# Patient Record
Sex: Male | Born: 1955 | Race: White | Hispanic: No | Marital: Married | Smoking: Never smoker
Health system: Southern US, Community
[De-identification: ages and names within clinical notes are randomized; demographics above are authoritative.]

## PROBLEM LIST (undated history)

## (undated) ENCOUNTER — Emergency Department (HOSPITAL_BASED_OUTPATIENT_CLINIC_OR_DEPARTMENT_OTHER): Admission: EM | Payer: Self-pay | Source: Home / Self Care

## (undated) DIAGNOSIS — Z87442 Personal history of urinary calculi: Secondary | ICD-10-CM

## (undated) DIAGNOSIS — I4819 Other persistent atrial fibrillation: Secondary | ICD-10-CM

## (undated) DIAGNOSIS — E785 Hyperlipidemia, unspecified: Secondary | ICD-10-CM

## (undated) DIAGNOSIS — N200 Calculus of kidney: Secondary | ICD-10-CM

## (undated) HISTORY — DX: Calculus of kidney: N20.0

## (undated) HISTORY — DX: Hyperlipidemia, unspecified: E78.5

## (undated) HISTORY — PX: OTHER SURGICAL HISTORY: SHX169

## (undated) HISTORY — PX: EYE SURGERY: SHX253

## (undated) HISTORY — DX: Other persistent atrial fibrillation: I48.19

---

## 2013-05-09 ENCOUNTER — Encounter: Payer: Self-pay | Admitting: Internal Medicine

## 2013-05-31 DIAGNOSIS — I4891 Unspecified atrial fibrillation: Secondary | ICD-10-CM

## 2013-05-31 DIAGNOSIS — I4819 Other persistent atrial fibrillation: Secondary | ICD-10-CM

## 2013-05-31 HISTORY — DX: Other persistent atrial fibrillation: I48.19

## 2013-05-31 HISTORY — DX: Unspecified atrial fibrillation: I48.91

## 2013-06-06 ENCOUNTER — Ambulatory Visit (INDEPENDENT_AMBULATORY_CARE_PROVIDER_SITE_OTHER): Payer: Managed Care, Other (non HMO) | Admitting: Internal Medicine

## 2013-06-06 ENCOUNTER — Encounter: Payer: Self-pay | Admitting: Internal Medicine

## 2013-06-06 VITALS — BP 118/84 | HR 58 | Ht 74.0 in | Wt 188.0 lb

## 2013-06-06 DIAGNOSIS — I4891 Unspecified atrial fibrillation: Secondary | ICD-10-CM

## 2013-06-06 LAB — T4, FREE: Free T4: 0.95 ng/dL (ref 0.60–1.60)

## 2013-06-06 LAB — TSH: TSH: 1.14 u[IU]/mL (ref 0.35–5.50)

## 2013-06-06 NOTE — Progress Notes (Signed)
Primary Care / Referring Physician: Garlan FillersPATERSON,DANIEL G, MD   Lalla BrothersWilliam Mesch is a 58 y.o. male with a h/o newly diagnosed atrial fibrillation who presents today for cardiology consultation.  He reports that he was being evaluated by Urology 05/30/13 and was noted to have an irregular and fast heart beat.  He had an ekg obtained by Dr Jarold MottoPatterson which revealed afib with RVR.  He was placed on toprol and eliquis.  His insurance "would not allow eliquis" and he was therefore placed on coumadin.  He has done well since that time.   He reports that he has symptoms of fatigue with his afib.  He reports associated anxiety and "nervousness".  He thinks that retrospectively he has had these symptoms for several months.  Symptoms have been somewhat insidious.  He is unaware of triggers or precipitants.  He thinks that drinking ETOh over the holidays may have triggered increased episode.  Today, he denies symptoms of chest pain, shortness of breath, orthopnea, PND, lower extremity edema, dizziness, presyncope, syncope, or neurologic sequela. The patient is tolerating medications without difficulties and is otherwise without complaint today.   Past Medical History  Diagnosis Date  . Hyperlipidemia   . Nephrolithiasis   . Atrial fibrillation 05/31/13    New onset a fib w/RVR   Past Surgical History  Procedure Laterality Date  . None      Current Outpatient Prescriptions  Medication Sig Dispense Refill  . ASCORBIC ACID PO Take 1 tablet by mouth daily.      . Cholecalciferol (VITAMIN D PO) Take 1 capsule by mouth daily.      Marland Kitchen. doxycycline (VIBRA-TABS) 100 MG tablet Take 1 tablet by mouth 2 (two) times daily.      . metoprolol succinate (TOPROL-XL) 50 MG 24 hr tablet Take 50 mg by mouth daily. Take with or immediately following a meal.      . Multiple Vitamin (MULTIVITAMIN) capsule Take 1 capsule by mouth daily. (Centrum)      . vitamin E 400 UNIT capsule Take 400 Units by mouth daily.      Marland Kitchen. warfarin  (COUMADIN) 5 MG tablet Take 1 tablet by mouth daily.       No current facility-administered medications for this visit.    No Known Allergies  History   Social History  . Marital Status: Unknown    Spouse Name: N/A    Number of Children: N/A  . Years of Education: N/A   Occupational History  . Not on file.   Social History Main Topics  . Smoking status: Never Smoker   . Smokeless tobacco: Not on file  . Alcohol Use: 4.2 oz/week    7 Cans of beer per week     Comment: 1 beer a day, 3-5 beers on the weekends  . Drug Use: No  . Sexual Activity: Not on file   Other Topics Concern  . Not on file   Social History Narrative   Pt lives in [place].   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 CAD      < 55   . Hypertension      ROS- All systems are reviewed and negative except as per the HPI above, also has symptoms of prostatitis, denies snoring/ sleep apnea  Physical Exam: Filed Vitals:   06/06/13 1149  BP: 118/84  Pulse: 58  Height: 6\' 2"  (1.88 m)  Weight: 188 lb (85.276 kg)    GEN- The patient is well appearing, alert and oriented x 3 today.   Head- normocephalic, atraumatic Eyes-  Sclera clear, conjunctiva pink Ears- hearing intact Oropharynx- clear Neck- supple, no JVP Lymph- no cervical lymphadenopathy Lungs- Clear to ausculation bilaterally, normal work of breathing Heart- Regular rate and rhythm, no murmurs, rubs or gallops, PMI not laterally displaced GI- soft, NT, ND, + BS Extremities- no clubbing, cyanosis, or edema MS- no significant deformity or atrophy Skin- no rash or lesion Psych- euthymic mood, full affect Neuro- strength and sensation are intact  EKG today reveals sinus rhythm 58 bpm, otherwise normal ekg ekg 05/30/13 [facility]) documents afib at 146 bpm  Assessment and Plan:  1. Paroxysmal atrial fibrillation The patient presents with a  new diagnosis of afib.  He has well documented afib with RVR from [facility].  He has been placed on toprol and is tolerating this well.  Appropriately, he has placed on anticoagulation in case he did not convert to sinus and cardioversion was required.  Fortunately, he has spontaneously converted to sinus.  As his chads2vasc score is 0, I will stop anticoagulation at this time. He denies symptoms of sleep apnea.  He does drink ETOH heavily and admits to drinking more frequently recently.  I will therefore recommend ETOH cessation/ moderation going forward.  I will obtain TFTs today as well as an echo to evaluate for structural changes. I will not add any additional therapies at this time.  If his afib burden increases, I think that our options are multaq or flecainide.  I would reserve ablation for afib refractory to a trial of AAD therapy.  He will return in 6 weeks for follow-up He will contact my office if problems arise in the interim.

## 2013-06-06 NOTE — Patient Instructions (Addendum)
Your physician recommends that you schedule a follow-up appointment in: 6 weeks with Dr Johney Frame  Your physician recommends that you return for lab work today: TSH Free T4  Your physician has recommended you make the following change in your medication:  1) STOP Coumadin   No Alcohol    Your physician has requested that you have an echocardiogram. Echocardiography is a painless test that uses sound waves to create images of your heart. It provides your doctor with information about the size and shape of your heart and how well your heart's chambers and valves are working. This procedure takes approximately one hour. There are no restrictions for this procedure.

## 2013-06-15 ENCOUNTER — Ambulatory Visit (HOSPITAL_COMMUNITY): Payer: Managed Care, Other (non HMO) | Attending: Internal Medicine | Admitting: Radiology

## 2013-06-15 ENCOUNTER — Encounter: Payer: Self-pay | Admitting: Internal Medicine

## 2013-06-15 DIAGNOSIS — I059 Rheumatic mitral valve disease, unspecified: Secondary | ICD-10-CM | POA: Insufficient documentation

## 2013-06-15 DIAGNOSIS — Z8249 Family history of ischemic heart disease and other diseases of the circulatory system: Secondary | ICD-10-CM | POA: Insufficient documentation

## 2013-06-15 DIAGNOSIS — E785 Hyperlipidemia, unspecified: Secondary | ICD-10-CM | POA: Insufficient documentation

## 2013-06-15 DIAGNOSIS — I4891 Unspecified atrial fibrillation: Secondary | ICD-10-CM

## 2013-06-15 NOTE — Progress Notes (Signed)
Echocardiogram performed.  

## 2013-07-18 ENCOUNTER — Ambulatory Visit (INDEPENDENT_AMBULATORY_CARE_PROVIDER_SITE_OTHER): Payer: Managed Care, Other (non HMO) | Admitting: Internal Medicine

## 2013-07-18 ENCOUNTER — Encounter: Payer: Self-pay | Admitting: Internal Medicine

## 2013-07-18 VITALS — BP 136/88 | HR 61 | Ht 74.0 in | Wt 190.2 lb

## 2013-07-18 DIAGNOSIS — I4891 Unspecified atrial fibrillation: Secondary | ICD-10-CM

## 2013-07-18 MED ORDER — DILTIAZEM HCL ER COATED BEADS 120 MG PO CP24: 120.0000 mg | ORAL_CAPSULE | Freq: Every day | ORAL | Status: DC

## 2013-07-18 NOTE — Patient Instructions (Signed)
Your physician wants you to follow-up in: 4 months with Tyler Herrera and 12 months with Dr Jacquiline Doe will receive a reminder letter in the mail two months in advance. If you don't receive a letter, please call our office to schedule the follow-up appointment.  Your physician has recommended you make the following change in your medication:  1) Stop Metoprolol 2) Start Cardizem 120mg  daily

## 2013-07-19 NOTE — Progress Notes (Signed)
Primary Care / Referring Physician: Garlan FillersPATERSON,DANIEL G, MD   Lalla BrothersWilliam Herrera is a 58 y.o. male with a h/o recently diagnosed atrial fibrillation who presents today for EP follow-up. He has done well since I saw him last, without symptomatic recurrence of afib.  He has tapered his ETOH intake.  He has some fatigue with beta blockers.  Today, he denies symptoms of chest pain, shortness of breath, orthopnea, PND, lower extremity edema, dizziness, presyncope, syncope, or neurologic sequela. The patient is tolerating medications without difficulties and is otherwise without complaint today.   Past Medical History  Diagnosis Date  . Hyperlipidemia   . Nephrolithiasis   . Atrial fibrillation 05/31/13    New onset a fib w/RVR   Past Surgical History  Procedure Laterality Date  . None      Current Outpatient Prescriptions  Medication Sig Dispense Refill  . ASCORBIC ACID PO Take 1 tablet by mouth daily.      . Cholecalciferol (VITAMIN D PO) Take 1 capsule by mouth daily.      Marland Kitchen. doxycycline (VIBRA-TABS) 100 MG tablet Take 1 tablet by mouth 2 (two) times daily.      . Multiple Vitamin (MULTIVITAMIN) capsule Take 1 capsule by mouth daily. (Centrum)      . vitamin E 400 UNIT capsule Take 400 Units by mouth daily.      Marland Kitchen. diltiazem (CARDIZEM CD) 120 MG 24 hr capsule Take 1 capsule (120 mg total) by mouth daily.  90 capsule  3   No current facility-administered medications for this visit.    No Known Allergies  History   Social History  . Marital Status: Unknown    Spouse Name: N/A    Number of Children: N/A  . Years of Education: N/A   Occupational History  . Not on file.   Social History Main Topics  . Smoking status: Never Smoker   . Smokeless tobacco: Not on file  . Alcohol Use: 4.2 oz/week    7 Cans of beer per week     Comment: 1 beer a day, 3-5 beers on the weekends  . Drug Use: No  . Sexual Activity: Not on file   Other Topics Concern  . Not on file   Social History  Narrative   Pt lives in [place] in [place].   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 CAD      < 55   . Hypertension      ROS- All systems are reviewed and negative except as per the HPI above, also has symptoms of prostatitis, denies snoring/ sleep apnea  Physical Exam: Filed Vitals:   07/18/13 1550  BP: 136/88  Pulse: 61  Height: 6\' 2"  (1.88 m)  Weight: 190 lb 3.2 oz (86.274 kg)    GEN- The patient is well appearing, alert and oriented x 3 today.   Head- normocephalic, atraumatic Eyes-  Sclera clear, conjunctiva pink Ears- hearing intact Oropharynx- clear Neck- supple, no JVP Lymph- no cervical lymphadenopathy Lungs- Clear to ausculation bilaterally, normal work of breathing Heart- Regular rate and rhythm, no murmurs, rubs or gallops, PMI not laterally displaced GI- soft, NT, ND, + BS Extremities- no clubbing, cyanosis, or edema MS- no significant deformity or atrophy Skin- no rash or lesion Psych- euthymic mood, full affect Neuro- strength and sensation are intact  EKG today reveals sinus rhythm  61 bpm, otherwise normal ekg ekg 05/30/13 ([facility]) documents afib at 146 bpm  Assessment and Plan:  1. Paroxysmal atrial fibrillation Maintaining sinus rhythm.  chads2vasc score is 0.  I will not therefore initiate anticoagulation at this time.  Continue ETOH cessation/ moderation going forward. TFTs and echo are reviewed with him today and were normal. If his afib burden increases, I think that our options are multaq or flecainide.  I would reserve ablation for afib refractory to a trial of AAD therapy. Given fatigue, I have switched metoprolol to diltiazem today.  He will return in 4 months for follow-up with Tereso Newcomer I will see him again in 1 year

## 2014-07-09 ENCOUNTER — Other Ambulatory Visit: Payer: Self-pay | Admitting: Internal Medicine

## 2014-08-05 ENCOUNTER — Other Ambulatory Visit: Payer: Self-pay | Admitting: Internal Medicine

## 2014-09-12 ENCOUNTER — Telehealth: Payer: Self-pay | Admitting: Internal Medicine

## 2014-09-12 NOTE — Telephone Encounter (Signed)
New problem   Need to speak nurse concerning the status of questionaire that was fax on 09/05/14. Please advise

## 2014-09-12 NOTE — Telephone Encounter (Signed)
lmom for Tyler Herrera  I have not gotten a faxed form

## 2014-09-18 NOTE — Telephone Encounter (Signed)
F/u   Want to know status of questionnaire. I advised Colin Mulders to re fax form.

## 2014-09-18 NOTE — Telephone Encounter (Signed)
Left Colin Mulders a message that Tresa Endo was not in the office today, and I will routing this message to [place].

## 2014-09-19 ENCOUNTER — Telehealth: Payer: Self-pay | Admitting: Internal Medicine

## 2014-09-19 NOTE — Telephone Encounter (Signed)
Left message on voicemail again to refax form as I have not received the form as of today

## 2014-09-19 NOTE — Telephone Encounter (Signed)
Form filled out and will fax today.  I have given to Selena Batten in medical records

## 2014-09-19 NOTE — Telephone Encounter (Signed)
EMSI questionnaire form filled out and faxed to [phone]. Scanned In

## 2014-10-09 ENCOUNTER — Telehealth: Payer: Self-pay | Admitting: Internal Medicine

## 2014-10-09 NOTE — Telephone Encounter (Signed)
This was faxed on 09/18/14  Will refax

## 2014-10-09 NOTE — Telephone Encounter (Signed)
[employer] company is calling because there was a cardiac questionnaire faxed in to the office on September 05, 2014 and they have not received it back. Please give them a call.

## 2014-10-10 ENCOUNTER — Telehealth: Payer: Self-pay | Admitting: Internal Medicine

## 2014-10-10 NOTE — Telephone Encounter (Signed)
EMSI questionnaire re-faxed to [phone].

## 2014-10-13 ENCOUNTER — Other Ambulatory Visit: Payer: Self-pay | Admitting: Internal Medicine

## 2014-10-15 ENCOUNTER — Other Ambulatory Visit: Payer: Self-pay

## 2014-10-15 MED ORDER — DILTIAZEM HCL ER COATED BEADS 120 MG PO CP24: 120.0000 mg | ORAL_CAPSULE | Freq: Every day | ORAL | Status: DC

## 2014-11-13 ENCOUNTER — Encounter: Payer: Self-pay | Admitting: Internal Medicine

## 2014-11-13 ENCOUNTER — Ambulatory Visit (INDEPENDENT_AMBULATORY_CARE_PROVIDER_SITE_OTHER): Payer: Managed Care, Other (non HMO) | Admitting: Internal Medicine

## 2014-11-13 VITALS — BP 126/84 | HR 60 | Ht 74.0 in | Wt 199.4 lb

## 2014-11-13 DIAGNOSIS — I480 Paroxysmal atrial fibrillation: Secondary | ICD-10-CM

## 2014-11-13 NOTE — Progress Notes (Signed)
  Primary Care / Referring Physician: Garlan Fillers, MD   Tyler Herrera is a 59 y.o. male with a h/o recently diagnosed atrial fibrillation who presents today for EP follow-up. He has done well since I saw him last, without symptomatic recurrence of afib.  He has tapered his ETOH intake.   Today, he denies symptoms of chest pain, shortness of breath, orthopnea, PND, lower extremity edema, dizziness, presyncope, syncope, or neurologic sequela. The patient is tolerating medications without difficulties and is otherwise without complaint today.   Past Medical History  Diagnosis Date  . Hyperlipidemia   . Nephrolithiasis   . Atrial fibrillation 05/31/13    New onset a fib w/RVR   Past Surgical History  Procedure Laterality Date  . None      Current Outpatient Prescriptions  Medication Sig Dispense Refill  . aspirin 81 MG tablet Take 81 mg by mouth daily.    Marland Kitchen diltiazem (CARDIZEM CD) 120 MG 24 hr capsule Take 1 capsule (120 mg total) by mouth daily. 90 capsule 0  . Multiple Vitamin (MULTIVITAMIN) capsule Take 1 capsule by mouth daily. (Centrum)     No current facility-administered medications for this visit.    No Known Allergies  History   Social History  . Marital Status: Unknown    Spouse Name: N/A  . Number of Children: N/A  . Years of Education: N/A   Occupational History  . Not on file.   Social History Main Topics  . Smoking status: Never Smoker   . Smokeless tobacco: Not on file  . Alcohol Use: 4.2 oz/week    7 Cans of beer per week     Comment: 1 beer a day, 3-5 beers on the weekends  . Drug Use: No  . Sexual Activity: Not on file   Other Topics Concern  . Not on file   Social History Narrative   Pt lives in [place] in [place].   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 CAD      < 55   . Hypertension      ROS- All systems are reviewed and negative except as per  the HPI above, also has symptoms of prostatitis, denies snoring/ sleep apnea  Physical Exam: Filed Vitals:   11/13/14 1621  BP: 126/84  Pulse: 60  Height: 6\' 2"  (1.88 m)  Weight: 90.447 kg (199 lb 6.4 oz)    GEN- The patient is well appearing, alert and oriented x 3 today.   Head- normocephalic, atraumatic Eyes-  Sclera clear, conjunctiva pink Ears- hearing intact Oropharynx- clear Neck- supple,  Lungs- Clear to ausculation bilaterally, normal work of breathing Heart- Regular rate and rhythm, no murmurs, rubs or gallops, PMI not laterally displaced GI- soft, NT, ND, + BS Extremities- no clubbing, cyanosis, or edema MS- no significant deformity or atrophy Skin- no rash or lesion  EKG today reveals sinus rhythm   Assessment and Plan:  1. Paroxysmal atrial fibrillation Maintaining sinus rhythm.  chads2vasc score is 0.  I will not therefore initiate anticoagulation at this time.  Continue ETOH cessation/ moderation going forward.   If his afib burden increases, I think that our options are multaq or flecainide.  I would reserve ablation for afib refractory to a trial of AAD therapy.   I will see him again in 1 year He will contact my office should problems arise in the interim.

## 2014-11-13 NOTE — Patient Instructions (Signed)

## 2015-01-08 ENCOUNTER — Other Ambulatory Visit: Payer: Self-pay | Admitting: Internal Medicine

## 2015-01-13 ENCOUNTER — Other Ambulatory Visit: Payer: Self-pay | Admitting: Internal Medicine

## 2016-01-08 ENCOUNTER — Other Ambulatory Visit: Payer: Self-pay | Admitting: Internal Medicine

## 2016-01-26 ENCOUNTER — Other Ambulatory Visit: Payer: Self-pay | Admitting: Internal Medicine

## 2016-01-26 MED ORDER — DILTIAZEM HCL ER COATED BEADS 120 MG PO CP24: 120.0000 mg | ORAL_CAPSULE | Freq: Every day | ORAL | 1 refills | Status: DC

## 2016-01-26 NOTE — Telephone Encounter (Signed)
Pt needed appt for refills, appt made with Allred for 02-26-16.

## 2016-01-26 NOTE — Telephone Encounter (Signed)
Pt sent a mychart message he needed an appt for refills, made appt for 02-26-16 with Allred, sent patient a message asking which med, pt states he only takes one, Diltiazem 120mg  24 hr Caps.

## 2016-01-26 NOTE — Telephone Encounter (Signed)
Pt's medication was refilled and sent to pt's pharmacy as requested. Confirmation received.  °

## 2016-01-26 NOTE — Telephone Encounter (Signed)
Please find out what medications pt is talking about. Thanks

## 2016-02-26 ENCOUNTER — Ambulatory Visit (INDEPENDENT_AMBULATORY_CARE_PROVIDER_SITE_OTHER): Payer: Managed Care, Other (non HMO) | Admitting: Internal Medicine

## 2016-02-26 ENCOUNTER — Other Ambulatory Visit: Payer: Self-pay

## 2016-02-26 VITALS — BP 150/98 | HR 114 | Ht 74.0 in | Wt 200.6 lb

## 2016-02-26 DIAGNOSIS — I4819 Other persistent atrial fibrillation: Secondary | ICD-10-CM

## 2016-02-26 DIAGNOSIS — I10 Essential (primary) hypertension: Secondary | ICD-10-CM

## 2016-02-26 DIAGNOSIS — I481 Persistent atrial fibrillation: Secondary | ICD-10-CM | POA: Diagnosis not present

## 2016-02-26 DIAGNOSIS — I480 Paroxysmal atrial fibrillation: Secondary | ICD-10-CM

## 2016-02-26 MED ORDER — DILTIAZEM HCL ER COATED BEADS 120 MG PO CP24: 240.0000 mg | ORAL_CAPSULE | Freq: Every day | ORAL | 3 refills | Status: DC

## 2016-02-26 NOTE — Progress Notes (Signed)
Primary Care / Referring Physician: Garlan FillersPATERSON,DANIEL G, MD  Lalla BrothersWilliam Dorce is a 60 y.o. male with a h/o persistent atrial fibrillation who presents today for EP follow-up.  He has rare palpitations but seems mostly asymptomatic with his afib.  He is in afib today and mostly unaware. Today, he denies symptoms of chest pain, shortness of breath, orthopnea, PND, lower extremity edema, dizziness, presyncope, syncope, or neurologic sequela. The patient is tolerating medications without difficulties and is otherwise without complaint today.   Past Medical History:  Diagnosis Date  . Atrial fibrillation 05/31/13   New onset a fib w/RVR  . Hyperlipidemia   . Nephrolithiasis    Past Surgical History:  Procedure Laterality Date  . none      Current Outpatient Prescriptions  Medication Sig Dispense Refill  . aspirin 81 MG tablet Take 81 mg by mouth daily.    Marland Kitchen. diltiazem (CARDIZEM CD) 120 MG 24 hr capsule Take 1 capsule (120 mg total) by mouth daily. 30 capsule 1  . Multiple Vitamin (MULTIVITAMIN) capsule Take 1 capsule by mouth daily. (Centrum)     No current facility-administered medications for this visit.     No Known Allergies  Social History   Social History  . Marital status: Unknown    Spouse name: N/A  . Number of children: N/A  . Years of education: N/A   Occupational History  . Not on file.   Social History Main Topics  . Smoking status: Never Smoker  . Smokeless tobacco: Not on file  . Alcohol use 4.2 oz/week    7 Cans of beer per week     Comment: 1 beer a day, 3-5 beers on the weekends  . Drug use: No  . Sexual activity: Not on file   Other Topics Concern  . Not on file   Social History Narrative   Pt lives in [place] in [place].   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 CAD      < 55   . Hypertension      ROS- All systems are reviewed and negative except as per the  HPI above, also has symptoms of prostatitis, denies snoring/ sleep apnea  Physical Exam: Vitals:   02/26/16 1611  BP: (!) 150/98  Pulse: (!) 114  Weight: 200 lb 9.6 oz (91 kg)  Height: 6\' 2"  (1.88 m)    GEN- The patient is well appearing, alert and oriented x 3 today.   Head- normocephalic, atraumatic Eyes-  Sclera clear, conjunctiva pink Ears- hearing intact Oropharynx- clear Neck- supple,  Lungs- Clear to ausculation bilaterally, normal work of breathing Heart- tachycardic irregular rhythm, no murmurs, rubs or gallops, PMI not laterally displaced GI- soft, NT, ND, + BS Extremities- no clubbing, cyanosis, or edema MS- no significant deformity or atrophy Skin- no rash or lesion  EKG today reveals afib with V rate 114 bpm,   Assessment and Plan:  1. Persistent atrial fibrillation Back in afib today but unaware.  V rates are elevated.  chads2vasc score is 0.  He is therefore not anticoagulated.  Given elevated BP today however, I suspect his chads2vasc score is actually 1.  We should probably reconsider anticoagulation.  Increase diltiazem today to 240mg  daily. Return to AF clinic in 1 week for additional rate control If he becomes more symptomatic then we should consider rhythm control.  Options include multaq, flecainide, and tikosyn.  Once  better rate controlled, we should also obtain an echo.  I worry that ongoing ETOH may be more of an issue than I am aware.  2. Elevated BP I suspect that he actually has hypertension Increase diltiazem as above  Follow-up in AF clinic in 1 week for additional rate control I will see in 6 weeks  Hillis Range MD, [facility] 02/26/2016 4:39 PM

## 2016-02-26 NOTE — Patient Instructions (Addendum)
Medication Instructions:  Your physician has recommended you make the following change in your medication:  1) Increase Diltaizem to 240 mg daily   Labwork: None ordered   Testing/Procedures: None ordered   Follow-Up: Your physician recommends that you schedule a follow-up appointment in: 1 week with Rudi Coco, NP in afib clinic and 6 weeks with Dr Johney Frame   Any Other Special Instructions Will Be Listed Below (If Applicable).     If you need a refill on your cardiac medications before your next appointment, please call your pharmacy.

## 2016-03-02 ENCOUNTER — Encounter: Payer: Self-pay | Admitting: Internal Medicine

## 2016-03-08 ENCOUNTER — Ambulatory Visit (HOSPITAL_COMMUNITY)
Admission: RE | Admit: 2016-03-08 | Discharge: 2016-03-08 | Disposition: A | Discharge status: Home / Self Care | Payer: Managed Care, Other (non HMO) | Source: Ambulatory Visit | Attending: Nurse Practitioner | Admitting: Nurse Practitioner

## 2016-03-08 ENCOUNTER — Encounter (HOSPITAL_COMMUNITY): Payer: Self-pay | Admitting: Nurse Practitioner

## 2016-03-08 VITALS — BP 136/78 | HR 82 | Ht 74.0 in | Wt 198.6 lb

## 2016-03-08 DIAGNOSIS — I481 Persistent atrial fibrillation: Secondary | ICD-10-CM | POA: Insufficient documentation

## 2016-03-08 DIAGNOSIS — I4819 Other persistent atrial fibrillation: Secondary | ICD-10-CM

## 2016-03-08 DIAGNOSIS — Z7982 Long term (current) use of aspirin: Secondary | ICD-10-CM | POA: Insufficient documentation

## 2016-03-08 DIAGNOSIS — E785 Hyperlipidemia, unspecified: Secondary | ICD-10-CM | POA: Diagnosis not present

## 2016-03-08 DIAGNOSIS — I480 Paroxysmal atrial fibrillation: Secondary | ICD-10-CM | POA: Diagnosis not present

## 2016-03-08 DIAGNOSIS — Z8249 Family history of ischemic heart disease and other diseases of the circulatory system: Secondary | ICD-10-CM | POA: Insufficient documentation

## 2016-03-08 DIAGNOSIS — Z87442 Personal history of urinary calculi: Secondary | ICD-10-CM | POA: Insufficient documentation

## 2016-03-09 ENCOUNTER — Other Ambulatory Visit: Payer: Self-pay

## 2016-03-09 MED ORDER — DILTIAZEM HCL ER COATED BEADS 120 MG PO CP24: 240.0000 mg | ORAL_CAPSULE | Freq: Every day | ORAL | 3 refills | Status: DC

## 2016-03-09 NOTE — Progress Notes (Signed)
Primary Care Physician: Garlan Fillers, MD Referring Physician: Dr. Georgie Chard is a 60 y.o. male with a h/o persistent afib that is in the afib clinic for follow up of appointment with Dr. Johney Frame 10/12. Pt was found to be in afib but mostly asymptomatic. He was given a chadsvasc score of 1 for elevated BP that day but has never been dx with HTN. It was decided to try rate control and start OAC's if SR was to be pursued.  Pt reports that currently afib does not seem to be bothering him. He thinks increase of cardizem may have affected his libido and questioned if he could go back to one tab daily. He does walk 4-5 miles daily, drinks 4-5 drinks over the weekend, does not snore, minimal caffeine.  Rhythm control was discussed but he is interested in minimal medications and this would entail ultimately more medications.    Today, he denies symptoms of palpitations, chest pain, shortness of breath, orthopnea, PND, lower extremity edema, dizziness, presyncope, syncope, or neurologic sequela. The patient is tolerating medications without difficulties and is otherwise without complaint today.   Past Medical History:  Diagnosis Date  . Atrial fibrillation (HCC) 05/31/13   New onset a fib w/RVR  . Hyperlipidemia   . Nephrolithiasis    Past Surgical History:  Procedure Laterality Date  . none      Current Outpatient Prescriptions  Medication Sig Dispense Refill  . aspirin 81 MG tablet Take 81 mg by mouth daily.    Marland Kitchen diltiazem (CARDIZEM CD) 120 MG 24 hr capsule Take 2 capsules (240 mg total) by mouth daily. 90 capsule 3  . Multiple Vitamin (MULTIVITAMIN) capsule Take 1 capsule by mouth daily. (Centrum)     No current facility-administered medications for this encounter.     No Known Allergies  Social History   Social History  . Marital status: Unknown    Spouse name: N/A  . Number of children: N/A  . Years of education: N/A   Occupational History  . Not on  file.   Social History Main Topics  . Smoking status: Never Smoker  . Smokeless tobacco: Not on file  . Alcohol use 4.2 oz/week    7 Cans of beer per week     Comment: 1 beer a day, 3-5 beers on the weekends  . Drug use: No  . Sexual activity: Not on file   Other Topics Concern  . Not on file   Social History Narrative   Pt lives in [place] in [place].   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 CAD      < 55   . Hypertension      ROS- All systems are reviewed and negative except as per the HPI above  Physical Exam: Vitals:   03/08/16 1531  BP: 136/78  Pulse: 82  Weight: 198 lb 9.6 oz (90.1 kg)  Height: 6\' 2"  (1.88 m)    GEN- The patient is well appearing, alert and oriented x 3 today.   Head- normocephalic, atraumatic Eyes-  Sclera clear, conjunctiva pink Ears- hearing intact Oropharynx- clear Neck- supple, no JVP Lymph- no cervical lymphadenopathy Lungs- Clear to ausculation bilaterally, normal work of breathing Heart- irregular rate and rhythm, no murmurs, rubs or gallops, PMI not laterally displaced GI- soft, NT, ND, + BS Extremities- no clubbing, cyanosis, or edema MS- no significant  deformity or atrophy Skin- no rash or lesion Psych- euthymic mood, full affect Neuro- strength and sensation are intact  EKG-afib at 82 bpm, qrs int 98 ms, qtc 441ms Epic records reviewed  Assessment and Plan:  1. Persistent afib, minimally asymptomatic For now, pt Is well  rate controlled and would like to continue with this approach, he questions if increase in Cardizem may have affected libido, but decreasing back to one a day would result in high v rates He [facility] 1 for elevated BP, under good control today) or rhythm  control at this time He would like to monitor the situation and discuss further with Dr. Johney FrameAllred in 4 weeks.  Encouraged to reduce alcohol to no more  than 2 a week Encouraged to continue his walking routine   Lupita LeashDonna C. Matthew Folksarroll, ANP-C [facility] [address] [place], [place] [address] [phone]

## 2016-03-19 ENCOUNTER — Other Ambulatory Visit: Payer: Self-pay | Admitting: Internal Medicine

## 2016-03-19 NOTE — Telephone Encounter (Signed)
diltiazem (CARDIZEM CD) 120 MG 24 hr capsule  Medication  Date: 03/09/2016 Department: [facility] [address] Office Ordering/Authorizing: Hillis Range, MD  Order Providers   Prescribing Provider Encounter Provider  Hillis Range, MD Drue Dun, CMA  Medication Detail    Disp Refills Start End   diltiazem (CARDIZEM CD) 120 MG 24 hr capsule 180 capsule 3 03/09/2016    Sig - Route: Take 2 capsules (240 mg total) by mouth daily. - Oral   E-Prescribing Status: Receipt confirmed by pharmacy (03/09/2016 1:30 PM EDT)   Pharmacy   [id number] - [place], [place] - [address] AT [address]

## 2016-04-01 ENCOUNTER — Other Ambulatory Visit: Payer: Managed Care, Other (non HMO)

## 2016-04-05 ENCOUNTER — Ambulatory Visit (INDEPENDENT_AMBULATORY_CARE_PROVIDER_SITE_OTHER): Payer: Managed Care, Other (non HMO) | Admitting: Internal Medicine

## 2016-04-05 ENCOUNTER — Encounter: Payer: Self-pay | Admitting: Internal Medicine

## 2016-04-05 VITALS — BP 124/78 | HR 79 | Ht 74.0 in | Wt 198.8 lb

## 2016-04-05 DIAGNOSIS — I4819 Other persistent atrial fibrillation: Secondary | ICD-10-CM

## 2016-04-05 DIAGNOSIS — I481 Persistent atrial fibrillation: Secondary | ICD-10-CM | POA: Diagnosis not present

## 2016-04-05 NOTE — Patient Instructions (Addendum)
Medication Instructions:  Your physician recommends that you continue on your current medications as directed. Please refer to the Current Medication list given to you today.   Labwork: None ordered   Testing/Procedures: Your physician has requested that you have an echocardiogram. Echocardiography is a painless test that uses sound waves to create images of your heart. It provides your doctor with information about the size and shape of your heart and how well your heart's chambers and valves are working. This procedure takes approximately one hour. There are no restrictions for this procedure.     Follow-Up: Your physician recommends that you schedule a follow-up appointment in: 3 months with Donna Carroll, NP   Any Other Special Instructions Will Be Listed Below (If Applicable).     If you need a refill on your cardiac medications before your next appointment, please call your pharmacy.   

## 2016-04-05 NOTE — Progress Notes (Signed)
   Primary Care / Referring Physician: Garlan Fillers, MD  Tyler Herrera is a 60 y.o. male with a h/o persistent atrial fibrillation who presents today for EP follow-up.  He thinks that he is asymptomatic with afib.  Doesn't want to make any changes. Today, he denies symptoms of chest pain, shortness of breath, orthopnea, PND, lower extremity edema, dizziness, presyncope, syncope, or neurologic sequela. The patient is tolerating medications without difficulties and is otherwise without complaint today.   Past Medical History:  Diagnosis Date  . Atrial fibrillation (HCC) 05/31/13   New onset a fib w/RVR  . Hyperlipidemia   . Nephrolithiasis    Past Surgical History:  Procedure Laterality Date  . none      Current Outpatient Prescriptions  Medication Sig Dispense Refill  . aspirin 81 MG tablet Take 81 mg by mouth daily.    Marland Kitchen diltiazem (CARDIZEM CD) 120 MG 24 hr capsule Take 2 capsules (240 mg total) by mouth daily. 180 capsule 3  . Multiple Vitamin (MULTIVITAMIN) capsule Take 1 capsule by mouth daily. (Centrum)     No current facility-administered medications for this visit.     No Known Allergies  Social History   Social History  . Marital status: Unknown    Spouse name: N/A  . Number of children: N/A  . Years of education: N/A   Occupational History  . Not on file.   Social History Main Topics  . Smoking status: Never Smoker  . Smokeless tobacco: Not on file  . Alcohol use 4.2 oz/week    7 Cans of beer per week     Comment: 1 beer a day, 3-5 beers on the weekends  . Drug use: No  . Sexual activity: Not on file   Other Topics Concern  . Not on file   Social History Narrative   Pt lives in [place] in [place].   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 CAD      < 55   . Hypertension      ROS- All systems are reviewed and negative except as per the HPI above   Physical  Exam: Vitals:   04/05/16 1357  BP: 124/78  Pulse: 79  Weight: 198 lb 12.8 oz (90.2 kg)  Height: 6\' 2"  (1.88 m)    GEN- The patient is well appearing, alert and oriented x 3 today.   Head- normocephalic, atraumatic Eyes-  Sclera clear, conjunctiva pink Ears- hearing intact Oropharynx- clear Neck- supple,  Lungs- Clear to ausculation bilaterally, normal work of breathing Heart- irregular rhythm, no murmurs, rubs or gallops, PMI not laterally displaced GI- soft, NT, ND, + BS Extremities- no clubbing, cyanosis, or edema MS- no significant deformity or atrophy Skin- no rash or lesion  EKG today reveals afib with V rate 79 bpm  Assessment and Plan:  1. Persistent atrial fibrillation Doing well with rate control Declines AAD and anticoagulation at this time though I have strongly encouraged anticoagulation for stroke prevention No changes today Repeat echo to evaluate for structural changes related to afib.  2. HTN At goal  3. ETOH He admits that he probably drinks too much but is trying to "cut back"  Follow-up in AF clinic every 3 months I will see when needed  Hillis Range MD, [facility] 04/05/2016 2:18 PM

## 2016-04-12 ENCOUNTER — Ambulatory Visit: Payer: Managed Care, Other (non HMO) | Admitting: Internal Medicine

## 2016-04-20 ENCOUNTER — Ambulatory Visit (HOSPITAL_COMMUNITY): Payer: Managed Care, Other (non HMO) | Attending: Cardiology

## 2016-04-20 ENCOUNTER — Other Ambulatory Visit: Payer: Self-pay

## 2016-04-20 DIAGNOSIS — I517 Cardiomegaly: Secondary | ICD-10-CM | POA: Insufficient documentation

## 2016-04-20 DIAGNOSIS — I340 Nonrheumatic mitral (valve) insufficiency: Secondary | ICD-10-CM | POA: Insufficient documentation

## 2016-04-20 DIAGNOSIS — I481 Persistent atrial fibrillation: Secondary | ICD-10-CM | POA: Insufficient documentation

## 2016-04-20 DIAGNOSIS — I4819 Other persistent atrial fibrillation: Secondary | ICD-10-CM

## 2016-04-20 DIAGNOSIS — I350 Nonrheumatic aortic (valve) stenosis: Secondary | ICD-10-CM | POA: Diagnosis not present

## 2016-04-20 DIAGNOSIS — I501 Left ventricular failure: Secondary | ICD-10-CM | POA: Insufficient documentation

## 2016-04-20 DIAGNOSIS — R002 Palpitations: Secondary | ICD-10-CM | POA: Insufficient documentation

## 2016-07-05 ENCOUNTER — Encounter (HOSPITAL_COMMUNITY): Payer: Self-pay | Admitting: Nurse Practitioner

## 2016-07-05 ENCOUNTER — Ambulatory Visit (HOSPITAL_COMMUNITY)
Admission: RE | Admit: 2016-07-05 | Discharge: 2016-07-05 | Disposition: A | Discharge status: Home / Self Care | Payer: Managed Care, Other (non HMO) | Source: Ambulatory Visit | Attending: Nurse Practitioner | Admitting: Nurse Practitioner

## 2016-07-05 VITALS — BP 146/78 | HR 144 | Ht 74.0 in | Wt 196.2 lb

## 2016-07-05 DIAGNOSIS — I481 Persistent atrial fibrillation: Secondary | ICD-10-CM | POA: Diagnosis not present

## 2016-07-05 DIAGNOSIS — Z8249 Family history of ischemic heart disease and other diseases of the circulatory system: Secondary | ICD-10-CM | POA: Insufficient documentation

## 2016-07-05 DIAGNOSIS — Z87442 Personal history of urinary calculi: Secondary | ICD-10-CM | POA: Diagnosis not present

## 2016-07-05 DIAGNOSIS — E785 Hyperlipidemia, unspecified: Secondary | ICD-10-CM | POA: Insufficient documentation

## 2016-07-05 DIAGNOSIS — I4819 Other persistent atrial fibrillation: Secondary | ICD-10-CM

## 2016-07-05 LAB — TSH: TSH: 1.054 u[IU]/mL (ref 0.350–4.500)

## 2016-07-05 LAB — CBC
HEMATOCRIT: 46.5 % (ref 39.0–52.0)
HEMOGLOBIN: 16.1 g/dL (ref 13.0–17.0)
MCH: 30 pg (ref 26.0–34.0)
MCHC: 34.6 g/dL (ref 30.0–36.0)
MCV: 86.8 fL (ref 78.0–100.0)
Platelets: 287 10*3/uL (ref 150–400)
RBC: 5.36 MIL/uL (ref 4.22–5.81)
RDW: 12.6 % (ref 11.5–15.5)
WBC: 7.2 10*3/uL (ref 4.0–10.5)

## 2016-07-05 LAB — COMPREHENSIVE METABOLIC PANEL
ALBUMIN: 4.6 g/dL (ref 3.5–5.0)
ALK PHOS: 64 U/L (ref 38–126)
ALT: 38 U/L (ref 17–63)
ANION GAP: 9 (ref 5–15)
AST: 32 U/L (ref 15–41)
BILIRUBIN TOTAL: 0.7 mg/dL (ref 0.3–1.2)
BUN: 19 mg/dL (ref 6–20)
CALCIUM: 9.7 mg/dL (ref 8.9–10.3)
CO2: 22 mmol/L (ref 22–32)
CREATININE: 1.01 mg/dL (ref 0.61–1.24)
Chloride: 107 mmol/L (ref 101–111)
GFR calc Af Amer: >60 mL/min (ref >60)
GFR calc non Af Amer: >60 mL/min (ref >60)
GLUCOSE: 128 mg/dL — AB (ref 65–99)
Potassium: 4.2 mmol/L (ref 3.5–5.1)
Sodium: 138 mmol/L (ref 135–145)
TOTAL PROTEIN: 7.1 g/dL (ref 6.5–8.1)

## 2016-07-05 LAB — MAGNESIUM: Magnesium: 2.1 mg/dL (ref 1.7–2.4)

## 2016-07-05 MED ORDER — RIVAROXABAN 20 MG PO TABS: 20.0000 mg | ORAL_TABLET | Freq: Every day | ORAL | 0 refills | Status: DC

## 2016-07-05 MED ORDER — METOPROLOL SUCCINATE ER 50 MG PO TB24: 50.0000 mg | ORAL_TABLET | Freq: Every day | ORAL | 3 refills | Status: DC

## 2016-07-05 MED ORDER — RIVAROXABAN 20 MG PO TABS: 20.0000 mg | ORAL_TABLET | Freq: Every day | ORAL | 3 refills | Status: DC

## 2016-07-05 NOTE — Progress Notes (Signed)
Primary Care Physician: Garlan Fillers, MD Referring Physician: Dr. Georgie Chard is a 61 y.o. male with a h/o persistent afib, with new onset 05/2013, that is in the afib clinic for follow up of echo ordered by Dr.Allred on office visit 04/05/16. It did show decline of EF to 30-35% form previous normal echo in 2015. In the office today his HR is 144. He does not feel the increased heart rate, asymptomatic. He has stopped drinking alcohol and has been on ASA only due to his preference. No significant snoring/apnea per wife. Minimal caffeine. He has been offered DOAC's/anticoagulants in the past and deferred, did not want extra meds.  Today, he denies symptoms of palpitations, chest pain, shortness of breath, orthopnea, PND, lower extremity edema, dizziness, presyncope, syncope, or neurologic sequela. The patient is tolerating medications without difficulties and is otherwise without complaint today.   Past Medical History:  Diagnosis Date  . Atrial fibrillation (HCC) 05/31/13   New onset a fib w/RVR  . Hyperlipidemia   . Nephrolithiasis    Past Surgical History:  Procedure Laterality Date  . none      Current Outpatient Prescriptions  Medication Sig Dispense Refill  . Multiple Vitamin (MULTIVITAMIN) capsule Take 1 capsule by mouth daily. (Centrum)    . metoprolol succinate (TOPROL XL) 50 MG 24 hr tablet Take 1 tablet (50 mg total) by mouth daily. Take with or immediately following a meal. 30 tablet 3  . rivaroxaban (XARELTO) 20 MG TABS tablet Take 1 tablet (20 mg total) by mouth daily with supper. 30 tablet 0   No current facility-administered medications for this encounter.     No Known Allergies  Social History   Social History  . Marital status: Unknown    Spouse name: N/A  . Number of children: N/A  . Years of education: N/A   Occupational History  . Not on file.   Social History Main Topics  . Smoking status: Never Smoker  . Smokeless tobacco:  Never Used  . Alcohol use 4.2 oz/week    7 Cans of beer per week     Comment: 1 beer a day, 3-5 beers on the weekends  . Drug use: No  . Sexual activity: Not on file   Other Topics Concern  . Not on file   Social History Narrative   Pt lives in [place] in [place].   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 CAD      < 55   . Hypertension      ROS- All systems are reviewed and negative except as per the HPI above  Physical Exam: Vitals:   07/05/16 0927  BP: (!) 146/78  Pulse: (!) 144  Weight: 196 lb 3.2 oz (89 kg)  Height: 6\' 2"  (1.88 m)    GEN- The patient is well appearing, alert and oriented x 3 today.   Head- normocephalic, atraumatic Eyes-  Sclera clear, conjunctiva pink Ears- hearing intact Oropharynx- clear Neck- supple, no JVP Lymph- no cervical lymphadenopathy Lungs- Clear to ausculation bilaterally, normal work of breathing Heart-rapid, irregular rate and rhythm, no murmurs, rubs or gallops, PMI not laterally displaced GI- soft, NT, ND, + BS Extremities- no clubbing, cyanosis, or edema MS- no significant deformity or atrophy Skin- no rash or lesion Psych- euthymic mood, full affect Neuro- strength and sensation are intact  EKG-afib at 144 bpm, qrs int  90 ms, qtc 452 ms  Epic records reviewed Echo-Study Conclusions  - Left ventricle: The cavity size was normal. Wall thickness was   increased in a pattern of mild LVH. Systolic function was   moderately to severely reduced. The estimated ejection fraction   was in the range of 30% to 35%. Diffuse hypokinesis. - Aortic valve: There was mild regurgitation. - Mitral valve: There was mild to moderate regurgitation. - Left atrium: The atrium was moderately dilated. 41 mm - Right ventricle: The cavity size was mildly dilated. - Right atrium: The atrium was moderately dilated.  Impressions:  - LV function difficult to  quantitate due to tachycardia (HR 140);   moderate to severe global reduction in LV systolic function;   moderate biatrial enlargement; mild AI; mild to moderate MR; mild   RVE; mild TR.   Assessment and Plan:  1. 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 Discussed antiarrythmic's with pt, why is was necessary to restore SR to improve EF and he is interested in initiation of tikosyn For better rate control with LV dysfunction, will stop cardizem and start metoprolol succinate 50 mg a day with possible up titration to control HR May need ACE in the future Full discussion re anticoagulants, risk vrs benefit discussed Bleeding precautions discussed Will have to start per guidelines for several weeks before attempting chemical or electrical cardioversion Will start xarelto 20 mg daily Cbc//mag/cmet/tsh today  F/u Friday for evaluation of rate control    Donna C. Matthew Folks [facility] [address] [place], [place] [address] [phone]

## 2016-07-05 NOTE — Patient Instructions (Signed)
Your physician has recommended you make the following change in your medication:  1)STOP aspirin 2)STOP cardizem 3)Start Xarelto 20mg  once a day with supper 4)Start Toprol (metoprolol) 50mg  once a day

## 2016-07-09 ENCOUNTER — Encounter (HOSPITAL_COMMUNITY): Payer: Self-pay | Admitting: Nurse Practitioner

## 2016-07-09 ENCOUNTER — Ambulatory Visit (HOSPITAL_COMMUNITY)
Admission: RE | Admit: 2016-07-09 | Discharge: 2016-07-09 | Disposition: A | Discharge status: Home / Self Care | Payer: Managed Care, Other (non HMO) | Source: Ambulatory Visit | Attending: Nurse Practitioner | Admitting: Nurse Practitioner

## 2016-07-09 VITALS — BP 126/78 | HR 112 | Ht 74.0 in | Wt 195.0 lb

## 2016-07-09 DIAGNOSIS — I481 Persistent atrial fibrillation: Secondary | ICD-10-CM | POA: Diagnosis not present

## 2016-07-09 DIAGNOSIS — Z8249 Family history of ischemic heart disease and other diseases of the circulatory system: Secondary | ICD-10-CM | POA: Diagnosis not present

## 2016-07-09 DIAGNOSIS — Z79899 Other long term (current) drug therapy: Secondary | ICD-10-CM | POA: Insufficient documentation

## 2016-07-09 DIAGNOSIS — E785 Hyperlipidemia, unspecified: Secondary | ICD-10-CM | POA: Insufficient documentation

## 2016-07-09 DIAGNOSIS — Z7901 Long term (current) use of anticoagulants: Secondary | ICD-10-CM | POA: Insufficient documentation

## 2016-07-09 DIAGNOSIS — I4819 Other persistent atrial fibrillation: Secondary | ICD-10-CM

## 2016-07-09 MED ORDER — METOPROLOL SUCCINATE ER 50 MG PO TB24: ORAL_TABLET | ORAL | 3 refills | Status: DC

## 2016-07-09 NOTE — Progress Notes (Signed)
Primary Care Physician: Garlan Fillers, MD Referring Physician: Dr. Georgie Chard is a 61 y.o. male with a h/o persistent afib, with new onset 05/2013, that is in the afib clinic for follow up of echo ordered by Dr.Allred on office visit 04/05/16. It did show decline of EF to 30-35% from previous normal echo in 2015. In the office today his HR is 144. He does not feel the increased heart rate, asymptomatic. He has stopped drinking alcohol and has been on ASA only due to his preference. No significant snoring/apnea per wife. Minimal caffeine. He has been offered DOAC's/anticoagulants in the past and deferred, did not want extra meds.  He was changed from cardizem to metoprolol for lv dysfunction and better rate control. He as also stared on DOAC's until  definite treatment could be determined, with a CHA2DS2VASc score of 1( lv dysfunction). We discussed restoration of SR with antiarrythmic on last visit. The cost of tikosyn has been determined to be too costly for the pt to use long term. Sotalol is an option but the pt does not feel bad and really other that rate control does not see the need to return SR. His last echo was performed in the setting of  RVR and possible EF looked worse than it actually is. He does have better rate control today but still not optimal.  Today, he denies symptoms of palpitations, chest pain, shortness of breath, orthopnea, PND, lower extremity edema, dizziness, presyncope, syncope, or neurologic sequela. The patient is tolerating medications without difficulties and is otherwise without complaint today.   Past Medical History:  Diagnosis Date  . Atrial fibrillation (HCC) 05/31/13   New onset a fib w/RVR  . Hyperlipidemia   . Nephrolithiasis    Past Surgical History:  Procedure Laterality Date  . none      Current Outpatient Prescriptions  Medication Sig Dispense Refill  . metoprolol succinate (TOPROL XL) 50 MG 24 hr tablet Take 1 tablet  (50mg ) in the morning and 1/2 tablet (25mg ) in the evening 45 tablet 3  . Multiple Vitamin (MULTIVITAMIN) capsule Take 1 capsule by mouth daily. (Centrum)    . rivaroxaban (XARELTO) 20 MG TABS tablet Take 1 tablet (20 mg total) by mouth daily with supper. 30 tablet 0   No current facility-administered medications for this encounter.     No Known Allergies  Social History   Social History  . Marital status: Unknown    Spouse name: N/A  . Number of children: N/A  . Years of education: N/A   Occupational History  . Not on file.   Social History Main Topics  . Smoking status: Never Smoker  . Smokeless tobacco: Never Used  . Alcohol use 4.2 oz/week    7 Cans of beer per week     Comment: 1 beer a day, 3-5 beers on the weekends  . Drug use: No  . Sexual activity: Not on file   Other Topics Concern  . Not on file   Social History Narrative   Pt lives in [place] in [place].   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 CAD      < 55   . Hypertension      ROS- All systems are reviewed and negative except as per the HPI above  Physical Exam: Vitals:   07/09/16 1126  BP: 126/78  Pulse: (!) 112  Weight: 195 lb (88.5 kg)  Height:  (1.88 m)    GEN- The patient is well appearing, alert and oriented x 3 today.   Head- normocephalic, atraumatic Eyes-  Sclera clear, conjunctiva pink Ears- hearing intact Oropharynx- clear Neck- supple, no JVP Lymph- no cervical lymphadenopathy Lungs- Clear to ausculation bilaterally, normal work of breathing Heart-rapid, irregular rate and rhythm, no murmurs, rubs or gallops, PMI not laterally displaced GI- soft, NT, ND, + BS Extremities- no clubbing, cyanosis, or edema MS- no significant deformity or atrophy Skin- no rash or lesion Psych- euthymic mood, full affect Neuro- strength and sensation are intact  EKG-afib at 112 bpm, qrs int 90 ms, qtc 477  ms  Epic records reviewed Echo-Study Conclusions  - Left ventricle: The cavity size was normal. Wall thickness was   increased in a pattern of mild LVH. Systolic function was   moderately to severely reduced. The estimated ejection fraction   was in the range of 30% to 35%. Diffuse hypokinesis. - Aortic valve: There was mild regurgitation. - Mitral valve: There was mild to moderate regurgitation. - Left atrium: The atrium was moderately dilated. 41 mm - Right ventricle: The cavity size was mildly dilated. - Right atrium: The atrium was moderately dilated.  Impressions:  - LV function difficult to quantitate due to tachycardia (HR 140);   moderate to severe global reduction in LV systolic function;   moderate biatrial enlargement; mild AI; mild to moderate MR; mild   RVE; mild TR.   Assessment and Plan:  1. 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,  Discussed antiarrythmic's with pt, he has determined that Tikosyn will be too expensive and he is really unsure if he wants any antiarrythmic that would entail hospitalization Might be interested in ablation, but does not want to fail an antiarrythmic first Explained to him, that was against guidelines and that ablation works best for paroxsymal afib where he has been persistent for some time May need ACE in the future Full discussion re anticoagulants, risk vrs benefit discussed Bleeding precautions discussed Will have to start per guidelines for several weeks before attempting chemical or electrical cardioversion Will start xarelto 20 mg daily, pt is taking x 5 days now, but doesn't want to take long term Increase metoprolol to 50 mg am and add 25 mg pm for better rate control Considration after good rate control to repeat a limited echo to see if EF appears improved, to help determine if restoration of SR is imperative    I will arrange for him to discuss options further with Dr. Johney Frame in about 2 weeks,  since he has so many questions re restoring SR and if he really needs to do this    Lupita Leash C. Matthew Folks [facility] [address] [place], [place] [address] [phone]

## 2016-07-09 NOTE — Patient Instructions (Signed)
Your physician has recommended you make the following change in your medication:  1)Increase metoprolol to 50mg  in the morning and 25mg  in the evening  Scheduler will be in touch with you to schedule follow up with Dr. Johney Frame in 2 weeks.

## 2016-07-14 ENCOUNTER — Encounter: Payer: Self-pay | Admitting: *Deleted

## 2016-07-21 ENCOUNTER — Ambulatory Visit (INDEPENDENT_AMBULATORY_CARE_PROVIDER_SITE_OTHER): Payer: Managed Care, Other (non HMO) | Admitting: Internal Medicine

## 2016-07-21 ENCOUNTER — Encounter: Payer: Self-pay | Admitting: Internal Medicine

## 2016-07-21 VITALS — BP 130/82 | HR 97 | Ht 74.0 in | Wt 195.2 lb

## 2016-07-21 DIAGNOSIS — I481 Persistent atrial fibrillation: Secondary | ICD-10-CM | POA: Diagnosis not present

## 2016-07-21 DIAGNOSIS — I5023 Acute on chronic systolic (congestive) heart failure: Secondary | ICD-10-CM

## 2016-07-21 DIAGNOSIS — I4819 Other persistent atrial fibrillation: Secondary | ICD-10-CM

## 2016-07-21 DIAGNOSIS — I10 Essential (primary) hypertension: Secondary | ICD-10-CM | POA: Diagnosis not present

## 2016-07-21 MED ORDER — CARVEDILOL 12.5 MG PO TABS: 12.5000 mg | ORAL_TABLET | Freq: Two times a day (BID) | ORAL | 3 refills | Status: DC

## 2016-07-21 NOTE — Patient Instructions (Addendum)
Medication Instructions:  Your physician has recommended you make the following change in your medication:  1) Stop Metoprolol 2) Start Carvedilol 12.5 mg twice daily   Labwork: None ordered   Testing/Procedures: Admit for Sotalol Load-- 08/10/16--- Hospital will call when bed available   Follow-Up: Your physician recommends that you schedule a follow-up appointment will be determined after discharge   Any Other Special Instructions Will Be Listed Below (If Applicable).     If you need a refill on your cardiac medications before your next appointment, please call your pharmacy.

## 2016-07-21 NOTE — Progress Notes (Signed)
Primary Care / Referring Physician: Garlan Fillers, MD  Tyler Herrera is a 61 y.o. male with a h/o persistent atrial fibrillation who presents today for EP follow-up.  He thinks that he has become more symptomatic with his afib.  He has developed systolic dysfunction.  He has SOB with moderate activity.  + reduced ETOH. Today, he denies symptoms of chest pain,  orthopnea, PND, lower extremity edema, dizziness, presyncope, syncope, or neurologic sequela. The patient is tolerating medications without difficulties and is otherwise without complaint today.   Past Medical History:  Diagnosis Date  . Hyperlipidemia   . Nephrolithiasis   . Persistent atrial fibrillation (HCC) 05/31/2013   Past Surgical History:  Procedure Laterality Date  . none      Current Outpatient Prescriptions  Medication Sig Dispense Refill  . Multiple Vitamin (MULTIVITAMIN) capsule Take 1 capsule by mouth daily. (Centrum)    . rivaroxaban (XARELTO) 20 MG TABS tablet Take 1 tablet (20 mg total) by mouth daily with supper. 30 tablet 0  . carvedilol (COREG) 12.5 MG tablet Take 1 tablet (12.5 mg total) by mouth 2 (two) times daily. 180 tablet 3   No current facility-administered medications for this visit.     No Known Allergies  Social History   Social History  . Marital status: Unknown    Spouse name: N/A  . Number of children: N/A  . Years of education: N/A   Occupational History  . Not on file.   Social History Main Topics  . Smoking status: Never Smoker  . Smokeless tobacco: Never Used  . Alcohol use 4.2 oz/week    7 Cans of beer per week     Comment: 1 beer a day, 3-5 beers on the weekends  . Drug use: No  . Sexual activity: Not on file   Other Topics Concern  . Not on file   Social History Narrative   Pt lives in [place] in [place].   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 CAD      < 55   .  Hypertension      ROS- All systems are reviewed and negative except as per the HPI above   Physical Exam: Vitals:   07/21/16 0922  BP: 130/82  Pulse: 97  SpO2: 97%  Weight: 195 lb 3.2 oz (88.5 kg)  Height: 6\' 2"  (1.88 m)    GEN- The patient is well appearing, alert and oriented x 3 today.   Head- normocephalic, atraumatic Eyes-  Sclera clear, conjunctiva pink Ears- hearing intact Oropharynx- clear Neck- supple,  Lungs- Clear to ausculation bilaterally, normal work of breathing Heart- irregular rhythm, no murmurs, rubs or gallops, PMI not laterally displaced GI- soft, NT, ND, + BS Extremities- no clubbing, cyanosis, or edema MS- no significant deformity or atrophy Skin- no rash or lesion  EKG today reveals afib with V rate 97bpm  Assessment and Plan:  1. Persistent atrial fibrillation Has not tolerated metoprolol due to fatigue. Given reduced EF and symptoms, will stop toprol and start on coreg 12.5mg  BID today Therapeutic strategies for afib including rate control and rhythm control were discussed in detail with the patient today.  We discussed tikosyn and sotalol as options.  Given costs concerns, he would prefer sotalol. Reports compliance with anticoagulation without interruption. Will therefore arrange admission for sotalol in the near future.  2. HTN Stable No change required today  3.  ETOH Avoidance encouraged  4. Acute systolic dysfunction Echo 12/17 reviewed Likely due to afib with RVR. Coreg as above Pursue sinus rhythm If EF does not recover, additional risk stratification will be planned Consider adding ace inhibitor though he is overwhelmed by changes today  Today, I have spent 40 minutes with the patient discussing afib and reduced EF.  More than 50% of the visit time today was spent on this issue.   Hillis Range MD, [facility] 07/21/2016 4:41 PM

## 2016-08-09 ENCOUNTER — Telehealth: Payer: Self-pay | Admitting: Internal Medicine

## 2016-08-09 NOTE — Telephone Encounter (Signed)
Patient wife is calling about procedure for tomorrow, states that patient was supposed to go to the hospital but when she contacted hospital they did not have any information on the procedure. Please call to discuss, thanks.

## 2016-08-09 NOTE — Telephone Encounter (Signed)
It shows in chart pending admission.  I called and admitting shows that also.  Left voicemail that bed control will call him when bed is ready

## 2016-08-10 ENCOUNTER — Inpatient Hospital Stay (HOSPITAL_COMMUNITY)
Admission: RE | Admit: 2016-08-10 | Discharge: 2016-08-13 | DRG: 308 | Disposition: A | Discharge status: Home / Self Care | Payer: Managed Care, Other (non HMO) | Source: Ambulatory Visit | Attending: Internal Medicine | Admitting: Internal Medicine

## 2016-08-10 ENCOUNTER — Encounter (HOSPITAL_COMMUNITY): Payer: Self-pay

## 2016-08-10 DIAGNOSIS — I481 Persistent atrial fibrillation: Secondary | ICD-10-CM | POA: Diagnosis present

## 2016-08-10 DIAGNOSIS — Z79899 Other long term (current) drug therapy: Secondary | ICD-10-CM

## 2016-08-10 DIAGNOSIS — E785 Hyperlipidemia, unspecified: Secondary | ICD-10-CM | POA: Diagnosis present

## 2016-08-10 DIAGNOSIS — I255 Ischemic cardiomyopathy: Secondary | ICD-10-CM | POA: Diagnosis present

## 2016-08-10 DIAGNOSIS — I10 Essential (primary) hypertension: Secondary | ICD-10-CM | POA: Diagnosis present

## 2016-08-10 DIAGNOSIS — Z7901 Long term (current) use of anticoagulants: Secondary | ICD-10-CM | POA: Diagnosis not present

## 2016-08-10 DIAGNOSIS — Z8249 Family history of ischemic heart disease and other diseases of the circulatory system: Secondary | ICD-10-CM | POA: Diagnosis not present

## 2016-08-10 DIAGNOSIS — F1010 Alcohol abuse, uncomplicated: Secondary | ICD-10-CM | POA: Diagnosis present

## 2016-08-10 DIAGNOSIS — I4891 Unspecified atrial fibrillation: Secondary | ICD-10-CM | POA: Diagnosis present

## 2016-08-10 DIAGNOSIS — Z87442 Personal history of urinary calculi: Secondary | ICD-10-CM

## 2016-08-10 DIAGNOSIS — I5021 Acute systolic (congestive) heart failure: Secondary | ICD-10-CM | POA: Diagnosis present

## 2016-08-10 DIAGNOSIS — I110 Hypertensive heart disease with heart failure: Secondary | ICD-10-CM | POA: Diagnosis not present

## 2016-08-10 DIAGNOSIS — I5023 Acute on chronic systolic (congestive) heart failure: Secondary | ICD-10-CM | POA: Diagnosis not present

## 2016-08-10 DIAGNOSIS — I519 Heart disease, unspecified: Secondary | ICD-10-CM | POA: Diagnosis not present

## 2016-08-10 DIAGNOSIS — I428 Other cardiomyopathies: Secondary | ICD-10-CM | POA: Diagnosis not present

## 2016-08-10 LAB — CBC WITH DIFFERENTIAL/PLATELET
BASOS ABS: 0.1 10*3/uL (ref 0.0–0.1)
Basophils Relative: 1 %
Eosinophils Absolute: 0.2 10*3/uL (ref 0.0–0.7)
Eosinophils Relative: 2 %
HEMATOCRIT: 42.4 % (ref 39.0–52.0)
Hemoglobin: 14.4 g/dL (ref 13.0–17.0)
LYMPHS ABS: 2.2 10*3/uL (ref 0.7–4.0)
Lymphocytes Relative: 35 %
MCH: 29.9 pg (ref 26.0–34.0)
MCHC: 34 g/dL (ref 30.0–36.0)
MCV: 88.1 fL (ref 78.0–100.0)
MONO ABS: 0.5 10*3/uL (ref 0.1–1.0)
Monocytes Relative: 8 %
NEUTROS PCT: 54 %
Neutro Abs: 3.3 10*3/uL (ref 1.7–7.7)
Platelets: 243 10*3/uL (ref 150–400)
RBC: 4.81 MIL/uL (ref 4.22–5.81)
RDW: 12.7 % (ref 11.5–15.5)
WBC: 6.2 10*3/uL (ref 4.0–10.5)

## 2016-08-10 LAB — COMPREHENSIVE METABOLIC PANEL
ALK PHOS: 53 U/L (ref 38–126)
ALT: 33 U/L (ref 17–63)
ANION GAP: 8 (ref 5–15)
AST: 30 U/L (ref 15–41)
Albumin: 3.9 g/dL (ref 3.5–5.0)
BUN: 21 mg/dL — ABNORMAL HIGH (ref 6–20)
CALCIUM: 9.3 mg/dL (ref 8.9–10.3)
CO2: 25 mmol/L (ref 22–32)
CREATININE: 1.15 mg/dL (ref 0.61–1.24)
Chloride: 107 mmol/L (ref 101–111)
Glucose, Bld: 138 mg/dL — ABNORMAL HIGH (ref 65–99)
Potassium: 3.7 mmol/L (ref 3.5–5.1)
SODIUM: 140 mmol/L (ref 135–145)
TOTAL PROTEIN: 5.8 g/dL — AB (ref 6.5–8.1)
Total Bilirubin: 0.3 mg/dL (ref 0.3–1.2)

## 2016-08-10 LAB — MAGNESIUM: MAGNESIUM: 2.1 mg/dL (ref 1.7–2.4)

## 2016-08-10 MED ORDER — ADULT MULTIVITAMIN W/MINERALS CH
1.0000 | ORAL_TABLET | Freq: Every day | ORAL | Status: DC
  Administered 2016-08-11 – 2016-08-13 (×3): 1 via ORAL
  Filled 2016-08-10 (×3): qty 1

## 2016-08-10 MED ORDER — MULTIVITAMINS PO CAPS: 1.0000 | ORAL_CAPSULE | Freq: Every day | ORAL | Status: DC

## 2016-08-10 MED ORDER — ACETAMINOPHEN 325 MG PO TABS: 650.0000 mg | ORAL_TABLET | ORAL | Status: DC | PRN

## 2016-08-10 MED ORDER — SOTALOL HCL 80 MG PO TABS
80.0000 mg | ORAL_TABLET | Freq: Two times a day (BID) | ORAL | Status: DC
  Administered 2016-08-10: 80 mg via ORAL
  Filled 2016-08-10: qty 1

## 2016-08-10 MED ORDER — CARVEDILOL 12.5 MG PO TABS
12.5000 mg | ORAL_TABLET | Freq: Two times a day (BID) | ORAL | Status: DC
  Administered 2016-08-11 (×2): 12.5 mg via ORAL
  Filled 2016-08-10 (×4): qty 1

## 2016-08-10 MED ORDER — ONDANSETRON HCL 4 MG/2ML IJ SOLN: 4.0000 mg | Freq: Four times a day (QID) | INTRAMUSCULAR | Status: DC | PRN

## 2016-08-10 MED ORDER — RIVAROXABAN 20 MG PO TABS
20.0000 mg | ORAL_TABLET | Freq: Every day | ORAL | Status: DC
  Administered 2016-08-11 – 2016-08-12 (×2): 20 mg via ORAL
  Filled 2016-08-10 (×2): qty 1

## 2016-08-10 NOTE — Progress Notes (Signed)
Patient is a direct admit for Sotalol dosing. Alert and oriented. Telemetry applied. CCMD called with two staff. Skin assessment done with two nurses. MD paged

## 2016-08-10 NOTE — H&P (Signed)
CARDIOLOGY H&P  HPI: 61 y.o. male w/ persistent AF and newly reduced LVEF presenting for elective admission for sotalol load.   Patient followed by Dr Johney Frame with EP. Has had "lone AF" for several years now. Treated with metoprolol and aspirin given that patient previously did not want to take systemic anticoagulant. On routine TTE in December 2017 patient was noted to have newly depressed LVEF to ~30%, global hypokinesis, moderate biatrial enlargement, and mild to moderate MR. Despite this he endorses only occasional palpitations that are minimally bothersome. He has had no chest pain, pressure, or escalating dyspnea on exertion. He denies PND, orthopnea, weight gain, edema.   The patient was started on rivaroxaban about 30 to 40 days ago and notes that he hasn't missed any doses. He has additionally been adherent to his carvedilol therapy. He currently has no complaints.   Review of Systems:     Cardiac Review of Systems: {Y] = yes [ ]  = no  Chest Pain [    ]  Resting SOB [   ] Exertional SOB  [  ]  Orthopnea [  ]   Pedal Edema [   ]    Palpitations [  ] Syncope  [  ]   Presyncope [   ]  General Review of Systems: [Y] = yes [  ]=no Constitional: recent weight change [  ]; anorexia [  ]; fatigue [  ]; nausea [  ]; night sweats [  ]; fever [  ]; or chills [  ];                                                                     Dental: poor dentition[  ];   Eye : blurred vision [  ]; diplopia [   ]; vision changes [  ];  Amaurosis fugax[  ]; Resp: cough [  ];  wheezing[  ];  hemoptysis[  ]; shortness of breath[  ]; paroxysmal nocturnal dyspnea[  ]; dyspnea on exertion[  ]; or orthopnea[  ];  GI:  gallstones[  ], vomiting[  ];  dysphagia[  ]; melena[  ];  hematochezia [  ]; heartburn[  ];   GU: kidney stones [  ]; hematuria[  ];   dysuria [  ];  nocturia[  ];               Skin: rash [  ], swelling[  ];, hair loss[  ];  peripheral edema[  ];  or itching[  ]; Musculosketetal: myalgias[  ];   joint swelling[  ];  joint erythema[  ];  joint pain[  ];  back pain[  ];  Heme/Lymph: bruising[  ];  bleeding[  ];  anemia[  ];  Neuro: TIA[  ];  headaches[  ];  stroke[  ];  vertigo[  ];  seizures[  ];   paresthesias[  ];  difficulty walking[  ];  Psych:depression[  ]; anxiety[  ];  Endocrine: diabetes[  ];  thyroid dysfunction[  ];  Other:  Past Medical History:  Diagnosis Date  . Hyperlipidemia   . Nephrolithiasis   . Persistent atrial fibrillation (HCC) 05/31/2013    Prior to Admission medications   Medication Sig Start Date End Date Taking?  Authorizing Provider  carvedilol (COREG) 12.5 MG tablet Take 1 tablet (12.5 mg total) by mouth 2 (two) times daily. 07/21/16 10/19/16 Yes Hillis Range, MD  Multiple Vitamin (MULTIVITAMIN) capsule Take 1 capsule by mouth daily. (Centrum)   Yes Historical Provider, MD  rivaroxaban (XARELTO) 20 MG TABS tablet Take 1 tablet (20 mg total) by mouth daily with supper. 07/05/16   Newman Nip, NP     No Known Allergies  Social History   Social History  . Marital status: Unknown    Spouse name: N/A  . Number of children: N/A  . Years of education: N/A   Occupational History  . Not on file.   Social History Main Topics  . Smoking status: Never Smoker  . Smokeless tobacco: Never Used  . Alcohol use 4.2 oz/week    7 Cans of beer per week     Comment: 1 beer a day, 3-5 beers on the weekends  . Drug use: No  . Sexual activity: Not on file   Other Topics Concern  . Not on file   Social History Narrative   Pt lives in [place] in [place].   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 CAD      < 55   . Hypertension      PHYSICAL EXAM: Vitals:   08/10/16 1941  BP: 110/79  Pulse: 84  Resp: 18  Temp: 97.5 F (36.4 C)   General:  Well appearing. No respiratory difficulty HEENT: normal Neck: supple. no JVD. Carotids 2+ bilat; no bruits. No  lymphadenopathy or thryomegaly appreciated. Cor: PMI nondisplaced. Irregularly irregular rhythm. No rubs, gallops or murmurs. Lungs: clear Abdomen: soft, nontender, nondistended. No hepatosplenomegaly. No bruits or masses. Good bowel sounds. Extremities: no cyanosis, clubbing, rash, edema Neuro: alert & oriented x 3, cranial nerves grossly intact. moves all 4 extremities w/o difficulty. Affect pleasant.  ECG: atrial fibrillation, HR 97, normal axis, QTc < (averaged over 10 RR intervals)  Labs: drawn and pending  ASSESSMENT: 61 y.o. male w/ persistent AF and newly reduced LVEF presenting for elective admission for sotalol load.   PLAN/DISCUSSION: - EP to see patient in AM - cont rivaroxaban home dose  daily - cont carvedilol home dose 12.5mg  BID - await labs for K and creatinine - plan to start sotalol  BID tonight - note that patient typically goes to bed around 8 or 9pm and awakes at 4 or 5pm, thus if his labs have not resulted by 10pm tonight then he is in agreement that we should start his sotalol tomorrow morning for Q12h dosing  FULL CODE  Fatima Blank, MD Cardiology Fellow

## 2016-08-11 DIAGNOSIS — I110 Hypertensive heart disease with heart failure: Secondary | ICD-10-CM

## 2016-08-11 DIAGNOSIS — I5023 Acute on chronic systolic (congestive) heart failure: Secondary | ICD-10-CM

## 2016-08-11 DIAGNOSIS — I481 Persistent atrial fibrillation: Principal | ICD-10-CM

## 2016-08-11 LAB — BASIC METABOLIC PANEL
ANION GAP: 8 (ref 5–15)
BUN: 21 mg/dL — ABNORMAL HIGH (ref 6–20)
CHLORIDE: 106 mmol/L (ref 101–111)
CO2: 25 mmol/L (ref 22–32)
Calcium: 9.1 mg/dL (ref 8.9–10.3)
Creatinine, Ser: 0.99 mg/dL (ref 0.61–1.24)
GFR calc non Af Amer: >60 mL/min (ref >60)
GLUCOSE: 102 mg/dL — AB (ref 65–99)
POTASSIUM: 3.8 mmol/L (ref 3.5–5.1)
Sodium: 139 mmol/L (ref 135–145)

## 2016-08-11 LAB — MAGNESIUM: Magnesium: 2.1 mg/dL (ref 1.7–2.4)

## 2016-08-11 LAB — HIV ANTIBODY (ROUTINE TESTING W REFLEX): HIV Screen 4th Generation wRfx: NONREACTIVE

## 2016-08-11 MED ORDER — SODIUM CHLORIDE 0.9% FLUSH: 3.0000 mL | INTRAVENOUS | Status: DC | PRN

## 2016-08-11 MED ORDER — SODIUM CHLORIDE 0.9% FLUSH
3.0000 mL | Freq: Two times a day (BID) | INTRAVENOUS | Status: DC
  Administered 2016-08-11 – 2016-08-13 (×4): 3 mL via INTRAVENOUS

## 2016-08-11 MED ORDER — SOTALOL HCL 80 MG PO TABS
120.0000 mg | ORAL_TABLET | Freq: Two times a day (BID) | ORAL | Status: DC
  Administered 2016-08-11 – 2016-08-13 (×5): 120 mg via ORAL
  Filled 2016-08-11 (×6): qty 2

## 2016-08-11 MED ORDER — SODIUM CHLORIDE 0.9 % IV SOLN
250.0000 mL | INTRAVENOUS | Status: DC
  Administered 2016-08-12: 250 mL via INTRAVENOUS

## 2016-08-11 MED ORDER — LOSARTAN POTASSIUM 25 MG PO TABS
25.0000 mg | ORAL_TABLET | Freq: Every day | ORAL | Status: DC
  Administered 2016-08-11 – 2016-08-13 (×2): 25 mg via ORAL
  Filled 2016-08-11 (×3): qty 1

## 2016-08-11 NOTE — Progress Notes (Signed)
   SUBJECTIVE: The patient is doing well today.  At this time, he denies chest pain, shortness of breath, or any new concerns.  . carvedilol  12.5 mg Oral BID  . multivitamin with minerals  1 tablet Oral Daily  . rivaroxaban  20 mg Oral Q supper  . sotalol  80 mg Oral Q12H     OBJECTIVE: Physical Exam: Vitals:   08/10/16 1941 08/11/16 0515  BP: 110/79 114/81  Pulse: 84 68  Resp: 18 20  Temp: 97.5 F (36.4 C) 98.3 F (36.8 C)  TempSrc: Oral Oral  SpO2: 97% 96%  Weight: 195 lb 9.6 oz (88.7 kg)   Height: 6\' 2"  (1.88 m)    No intake or output data in the 24 hours ending 08/11/16 0844  Telemetry reveals rate controlled afib  GEN- The patient is well appearing, alert and oriented x 3 today.   Head- normocephalic, atraumatic Eyes-  Sclera clear, conjunctiva pink Ears- hearing intact Oropharynx- clear Neck- supple  Lungs- Clear to ausculation bilaterally, normal work of breathing Heart- irregular rate and rhythm, no murmurs, rubs or gallops, PMI not laterally displaced GI- soft, NT, ND, + BS Extremities- no clubbing, cyanosis, or edema Skin- no rash or lesion Psych- euthymic mood, full affect Neuro- strength and sensation are intact  LABS: Basic Metabolic Panel:  Recent Labs  29/57/47 2047 08/11/16 0229  NA 140 139  K 3.7 3.8  CL 107 106  CO2 25 25  GLUCOSE 138* 102*  BUN 21* 21*  CREATININE 1.15 0.99  CALCIUM 9.3 9.1  MG 2.1 2.1   Liver Function Tests:  Recent Labs  08/10/16 2047  AST 30  ALT 33  ALKPHOS 53  BILITOT 0.3  PROT 5.8*  ALBUMIN 3.9   No results for input(s): LIPASE, AMYLASE in the last 72 hours. CBC:  Recent Labs  08/10/16 2047  WBC 6.2  NEUTROABS 3.3  HGB 14.4  HCT 42.4  MCV 88.1  PLT 243   ASSESSMENT AND PLAN:   1. Persistent atrial fibrillation Doing well with sotalol Will increase sotalol to 120mg  BID If still in af in am, will require cardioversion  2. HTN Stable No change required today  3. ETOH Avoidance  encouraged  4. Acute systolic dysfunction Pursue sinus rhythm as above If EF does not recover, additional risk stratification will be planned Will add losartan 25mg  daily as BP allows    Hillis Range, MD 08/11/2016 8:44 AM

## 2016-08-11 NOTE — Care Management Note (Signed)
Case Management Note Donn Pierini RN, BSN Unit 2W-Case Manager [phone]  Patient Details  Name: Tyler Herrera MRN: [id number] Date of Birth: Aug 31, 1955  Subjective/Objective:  Pt admitted with afib for Sotalol load                  Action/Plan: PTA pt lived at home- anticipate return home- CM to follow  Expected Discharge Date:  08/12/16               Expected Discharge Plan:  Home/Self Care  In-House Referral:     Discharge planning Services  CM Consult  Post Acute Care Choice:  NA Choice offered to:  NA  DME Arranged:    DME Agency:     HH Arranged:    HH Agency:     Status of Service:  In process, will continue to follow  If discussed at Long Length of Stay Meetings, dates discussed:    Additional Comments:  Darrold Span, RN 08/11/2016, 11:52 AM

## 2016-08-12 ENCOUNTER — Inpatient Hospital Stay (HOSPITAL_COMMUNITY): Payer: Managed Care, Other (non HMO) | Admitting: Anesthesiology

## 2016-08-12 ENCOUNTER — Encounter (HOSPITAL_COMMUNITY): Payer: Self-pay | Admitting: Anesthesiology

## 2016-08-12 ENCOUNTER — Encounter (HOSPITAL_COMMUNITY)
Admission: RE | Disposition: A | Discharge status: Home / Self Care | Payer: Self-pay | Source: Ambulatory Visit | Attending: Internal Medicine

## 2016-08-12 DIAGNOSIS — I10 Essential (primary) hypertension: Secondary | ICD-10-CM

## 2016-08-12 HISTORY — PX: CARDIOVERSION: SHX1299

## 2016-08-12 LAB — BASIC METABOLIC PANEL
Anion gap: 9 (ref 5–15)
BUN: 16 mg/dL (ref 6–20)
CHLORIDE: 107 mmol/L (ref 101–111)
CO2: 25 mmol/L (ref 22–32)
CREATININE: 1.04 mg/dL (ref 0.61–1.24)
Calcium: 9.1 mg/dL (ref 8.9–10.3)
GFR calc Af Amer: >60 mL/min (ref >60)
GFR calc non Af Amer: >60 mL/min (ref >60)
GLUCOSE: 103 mg/dL — AB (ref 65–99)
POTASSIUM: 4 mmol/L (ref 3.5–5.1)
Sodium: 141 mmol/L (ref 135–145)

## 2016-08-12 LAB — MAGNESIUM: Magnesium: 2.1 mg/dL (ref 1.7–2.4)

## 2016-08-12 SURGERY — CARDIOVERSION
Anesthesia: General

## 2016-08-12 MED ORDER — CARVEDILOL 3.125 MG PO TABS
3.1250 mg | ORAL_TABLET | Freq: Two times a day (BID) | ORAL | Status: DC
  Administered 2016-08-12 – 2016-08-13 (×3): 3.125 mg via ORAL
  Filled 2016-08-12 (×3): qty 1

## 2016-08-12 MED ORDER — PROPOFOL 10 MG/ML IV BOLUS
INTRAVENOUS | Status: DC | PRN
  Administered 2016-08-12: 100 mg via INTRAVENOUS

## 2016-08-12 MED ORDER — LIDOCAINE 2% (20 MG/ML) 5 ML SYRINGE
INTRAMUSCULAR | Status: DC | PRN
  Administered 2016-08-12: 60 mg via INTRAVENOUS

## 2016-08-12 NOTE — Transfer of Care (Signed)
Immediate Anesthesia Transfer of Care Note  Patient: Tyler Herrera  Procedure(s) Performed: Procedure(s): CARDIOVERSION (N/A)  Patient Location: Endoscopy Unit  Anesthesia Type:General  Level of Consciousness: awake, alert , oriented and patient cooperative  Airway & Oxygen Therapy: Patient Spontanous Breathing and Patient connected to nasal cannula oxygen  Post-op Assessment: Report given to RN, Post -op Vital signs reviewed and stable and Patient moving all extremities X 4  Post vital signs: Reviewed and stable  Last Vitals:  Vitals:   08/12/16 0914 08/12/16 0915  BP:  109/64  Pulse: 62 (!) 54  Resp: 20 16  Temp:  36.6 C    Last Pain:  Vitals:   08/12/16 0915  TempSrc: Oral         Complications: No apparent anesthesia complications

## 2016-08-12 NOTE — Interval H&P Note (Signed)
History and Physical Interval Note:  08/12/2016 8:08 AM  Tyler Herrera  has presented today for surgery, with the diagnosis of AFIB  The various methods of treatment have been discussed with the patient and family. After consideration of risks, benefits and other options for treatment, the patient has consented to  Procedure(s): CARDIOVERSION (N/A) as a surgical intervention .  The patient's history has been reviewed, patient examined, no change in status, stable for surgery.  I have reviewed the patient's chart and labs.  Questions were answered to the patient's satisfaction.     Charlton Haws

## 2016-08-12 NOTE — Discharge Summary (Signed)
ELECTROPHYSIOLOGY PROCEDURE DISCHARGE SUMMARY    Patient ID: Tyler Herrera,  MRN: [id number], DOB/AGE: [birth year] 61 y.o.  Admit date: 08/10/2016 Discharge date: 08/13/2016  Primary Care Physician: Garlan Fillers, MD Electrophysiologist: Josiane Labine  Primary Discharge Diagnosis:  1.  Persistent atrial fibrillation status post Sotalol loading this admission  Secondary Discharge Diagnosis:  1.  Hyperlipidemia 2.  HTN 3.  ETOH abuse 4.  Acute systolic dysfunction felt to be 2/2 AF with RVR   No Known Allergies   Procedures This Admission:  1.  Sotalol loading 2.  Direct current cardioversion on 08/12/16 by Dr Eden Emms which successfully restored SR.  There were no early apparent complications.   Brief HPI: Tyler Herrera is a 61 y.o. male with a past medical history as noted above.  He has developed more symptomatic atrial fibrillation as well as LV dysfunction felt to be 2/2 AF with RVR. Risks, benefits, and alternatives to Sotalol were reviewed with the patient who wished to proceed.    Hospital Course:  The patient was admitted and Sotalol was initiated.  Renal function and electrolytes were followed during the hospitalization.  Their QTc remained stable.  On 08/12/16 they underwent direct current cardioversion which restored sinus rhythm.  They were monitored until discharge on telemetry which demonstrated sinus rhythm with no ventricular arrhythmias.  Losartan was added for atrial remodeling and for nonischemic CM.  On the day of discharge, they were examined by Dr Johney Frame who considered them stable for discharge to home.  Follow-up has been arranged with AF clinic in 1 week and with Dr Johney Frame in 4 weeks.   Physical Exam: Vitals:   08/12/16 0958 08/12/16 1254 08/12/16 2117 08/13/16 0501  BP: 98/66 (!) 95/58 (!) 109/59 (!) 119/58  Pulse:  60 64 62  Resp:  18 18 18   Temp:  98 F (36.7 C) 97.7 F (36.5 C) 97.6 F (36.4 C)  TempSrc:  Oral Oral Oral  SpO2:  96% 98% 98%    Weight:      Height:        GEN- The patient is well appearing, alert and oriented x 3 today.   HEENT: normocephalic, atraumatic; sclera clear, conjunctiva pink; hearing intact; oropharynx clear; neck supple  Lungs- Clear to ausculation bilaterally, normal work of breathing.  No wheezes, rales, rhonchi Heart- Regular rate and rhythm, no murmurs, rubs or gallops  GI- soft, non-tender, non-distended, bowel sounds present  Extremities- no clubbing, cyanosis, or edema  MS- no significant deformity or atrophy Skin- warm and dry, no rash or lesion Psych- euthymic mood, full affect Neuro- strength and sensation are intact   Labs:   Lab Results  Component Value Date   WBC 6.2 08/10/2016   HGB 14.4 08/10/2016   HCT 42.4 08/10/2016   MCV 88.1 08/10/2016   PLT 243 08/10/2016    Recent Labs Lab 08/10/16 2047  08/13/16 0220  NA 140  < > 139  K 3.7  < > 4.1  CL 107  < > 107  CO2 25  < > 24  BUN 21*  < > 24*  CREATININE 1.15  < > 1.14  CALCIUM 9.3  < > 8.9  PROT 5.8*  --   --   BILITOT 0.3  --   --   ALKPHOS 53  --   --   ALT 33  --   --   AST 30  --   --   GLUCOSE 138*  < > 108*  < > =  values in this interval not displayed.   Discharge Medications:  Allergies as of 08/13/2016   No Known Allergies     Medication List    TAKE these medications   carvedilol 12.5 MG tablet Commonly known as:  COREG Take 1 tablet (12.5 mg total) by mouth 2 (two) times daily.   losartan 25 MG tablet Commonly known as:  COZAAR Take 1 tablet (25 mg total) by mouth daily.   multivitamin capsule Take 1 capsule by mouth daily. (Centrum)   rivaroxaban 20 MG Tabs tablet Commonly known as:  XARELTO Take 1 tablet (20 mg total) by mouth daily with supper.   sotalol 120 MG tablet Commonly known as:  BETAPACE Take 1 tablet (120 mg total) by mouth every 12 (twelve) hours.       Disposition:   Follow-up Information    [facility] Follow up on 08/19/2016.    Specialty:  Cardiology Why:  at 9:30AM  Contact information: [address] [id number] [place] [address] [phone]       Hillis Range, MD Follow up on 09/13/2016.   Specialty:  Cardiology Why:  1:45 pm Contact information: [address] [place] [address] [phone]           Duration of Discharge Encounter: Greater than 30 minutes including physician time.  Randolm Idol MD 08/13/2016 7:11 AM

## 2016-08-12 NOTE — Progress Notes (Signed)
   SUBJECTIVE: No events overnight.  Now sedated s/p cardioversion.  [facility] Hold] carvedilol  12.5 mg Oral BID  . [MAR Hold] losartan  25 mg Oral Daily  . [MAR Hold] multivitamin with minerals  1 tablet Oral Daily  . [MAR Hold] rivaroxaban  20 mg Oral Q supper  . [MAR Hold] sodium chloride flush  3 mL Intravenous Q12H  . [facility] Hold] sotalol  120 mg Oral Q12H   . sodium chloride 250 mL (08/12/16 0820)    OBJECTIVE: Physical Exam: Vitals:   08/12/16 0912 08/12/16 0913 08/12/16 0914 08/12/16 0915  BP:    109/64  Pulse: (!) 51 63 62 (!) 54  Resp: 14 14 20 16   Temp:    97.9 F (36.6 C)  TempSrc:    Oral  SpO2: 97% 97% 97% 97%  Weight:      Height:        Intake/Output Summary (Last 24 hours) at 08/12/16 8032 Last data filed at 08/12/16 0916  Gross per 24 hour  Intake              823 ml  Output                0 ml  Net              823 ml    Telemetry reveals rate controlled afib, now in sinus rhythm s/p cardioversion  GEN- The patient is well appearing, sedated post cardioversion Head- normocephalic, atraumatic Eyes-  Sclera clear, conjunctiva pink Ears- hearing intact Oropharynx- clear Neck- supple  Lungs- Clear to ausculation bilaterally, normal work of breathing Heart- regular rate and rhythm, no murmurs, rubs or gallops, PMI not laterally displaced GI- soft, NT, ND, + BS Extremities- no clubbing, cyanosis, or edema Skin- no rash or lesion Psych- euthymic mood, full affect Neuro- sedated  LABS: Basic Metabolic Panel:  Recent Labs  05/09/81 0229 08/12/16 0226  NA 139 141  K 3.8 4.0  CL 106 107  CO2 25 25  GLUCOSE 102* 103*  BUN 21* 16  CREATININE 0.99 1.04  CALCIUM 9.1 9.1  MG 2.1 2.1   Liver Function Tests:  Recent Labs  08/10/16 2047  AST 30  ALT 33  ALKPHOS 53  BILITOT 0.3  PROT 5.8*  ALBUMIN 3.9   No results for input(s): LIPASE, AMYLASE in the last 72 hours. CBC:  Recent Labs  08/10/16 2047  WBC 6.2  NEUTROABS 3.3  HGB 14.4    HCT 42.4  MCV 88.1  PLT 243   ASSESSMENT AND PLAN:   1. Persistent atrial fibrillation Now in sinus rhythm Continue on sotalol to 120mg  BID Stable QT Anticipate discharge tomorrow  2. HTN Stable No change required today  3.  Acute systolic dysfunction Pursue sinus rhythm as above Will continue on losartan and low dose coreg at discharge Reassess EF if still in sinus in 3 months  Anticipate discharge tomorrow am  Hillis Range, MD 08/12/2016 9:18 AM

## 2016-08-12 NOTE — CV Procedure (Signed)
DCC: Anesthesia [place] Propofol  DCC x 1 150J converted from afib rate 114 to NSR rate 65 On Rx Xarelto with no missed doses  No immediate neurologic sequelae  Charlton Haws

## 2016-08-12 NOTE — H&P (View-Only) (Signed)
   SUBJECTIVE: The patient is doing well today.  At this time, he denies chest pain, shortness of breath, or any new concerns.  . carvedilol  12.5 mg Oral BID  . multivitamin with minerals  1 tablet Oral Daily  . rivaroxaban  20 mg Oral Q supper  . sotalol  80 mg Oral Q12H     OBJECTIVE: Physical Exam: Vitals:   08/10/16 1941 08/11/16 0515  BP: 110/79 114/81  Pulse: 84 68  Resp: 18 20  Temp: 97.5 F (36.4 C) 98.3 F (36.8 C)  TempSrc: Oral Oral  SpO2: 97% 96%  Weight: 195 lb 9.6 oz (88.7 kg)   Height: 6' 2" (1.88 m)    No intake or output data in the 24 hours ending 08/11/16 0844  Telemetry reveals rate controlled afib  GEN- The patient is well appearing, alert and oriented x 3 today.   Head- normocephalic, atraumatic Eyes-  Sclera clear, conjunctiva pink Ears- hearing intact Oropharynx- clear Neck- supple  Lungs- Clear to ausculation bilaterally, normal work of breathing Heart- irregular rate and rhythm, no murmurs, rubs or gallops, PMI not laterally displaced GI- soft, NT, ND, + BS Extremities- no clubbing, cyanosis, or edema Skin- no rash or lesion Psych- euthymic mood, full affect Neuro- strength and sensation are intact  LABS: Basic Metabolic Panel:  Recent Labs  08/10/16 2047 08/11/16 0229  NA 140 139  K 3.7 3.8  CL 107 106  CO2 25 25  GLUCOSE 138* 102*  BUN 21* 21*  CREATININE 1.15 0.99  CALCIUM 9.3 9.1  MG 2.1 2.1   Liver Function Tests:  Recent Labs  08/10/16 2047  AST 30  ALT 33  ALKPHOS 53  BILITOT 0.3  PROT 5.8*  ALBUMIN 3.9   No results for input(s): LIPASE, AMYLASE in the last 72 hours. CBC:  Recent Labs  08/10/16 2047  WBC 6.2  NEUTROABS 3.3  HGB 14.4  HCT 42.4  MCV 88.1  PLT 243   ASSESSMENT AND PLAN:   1. Persistent atrial fibrillation Doing well with sotalol Will increase sotalol to 120mg BID If still in af in am, will require cardioversion  2. HTN Stable No change required today  3. ETOH Avoidance  encouraged  4. Acute systolic dysfunction Pursue sinus rhythm as above If EF does not recover, additional risk stratification will be planned Will add losartan 25mg daily as BP allows    Markiya Keefe, MD 08/11/2016 8:44 AM  

## 2016-08-12 NOTE — Anesthesia Postprocedure Evaluation (Addendum)
Anesthesia Post Note  Patient: Tyler Herrera  Procedure(s) Performed: Procedure(s) (LRB): CARDIOVERSION (N/A)  Patient location during evaluation: PACU Anesthesia Type: General Level of consciousness: awake Pain management: pain level controlled Vital Signs Assessment: post-procedure vital signs reviewed and stable Respiratory status: spontaneous breathing Cardiovascular status: stable Postop Assessment: no signs of nausea or vomiting Anesthetic complications: no        Last Vitals:  Vitals:   08/12/16 0930 08/12/16 0958  BP: 95/72 98/66  Pulse: 63   Resp: 16   Temp:      Last Pain:  Vitals:   08/12/16 0915  TempSrc: Oral   Pain Goal:                 Kemara Quigley JR,JOHN Timia Casselman

## 2016-08-12 NOTE — Anesthesia Preprocedure Evaluation (Addendum)
Anesthesia Evaluation  Patient identified by MRN, date of birth, ID band Patient awake    Reviewed: Allergy & Precautions, NPO status , Patient's Chart, lab work & pertinent test results, reviewed documented beta blocker date and time   Airway Mallampati: II  TM Distance: >3 FB Neck ROM: Full    Dental no notable dental hx. (+) Teeth Intact   Pulmonary    Pulmonary exam normal        Cardiovascular hypertension, Pt. on medications and Pt. on home beta blockers + dysrhythmias Atrial Fibrillation  Rhythm:Irregular Rate:Normal     Neuro/Psych    GI/Hepatic   Endo/Other    Renal/GU      Musculoskeletal   Abdominal Normal abdominal exam  (+)   Peds  Hematology   Anesthesia Other Findings Study Result   Result status: Final result                          [facility]                        [address]                        [place], [place] [address]                            [phone]  ------------------------------------------------------------------- Transthoracic Echocardiography  Patient:    Tyler Herrera, Tyler Herrera MR #:       [id number] Study Date: 04/20/2016 Gender:     M Age:        31 Height:     188 cm Weight:     90.2 kg BSA:        2.18 m^2 Pt. Status: Room:   ATTENDING    Olga Millers  ORDERING     Hillis Range, MD  REFERRING    Hillis Range, MD  SONOGRAPHER  Clearence Ped, RCS  PERFORMING   [facility], Outpatient  cc:  ------------------------------------------------------------------- LV EF: 30% -   35%  ------------------------------------------------------------------- Indications:      Atrial Fibrillation (I48.91).  ------------------------------------------------------------------- History:   PMH:  Hyperlipidemia.  Risk factors:  Hypertension.  ------------------------------------------------------------------- Study Conclusions  - Left ventricle: The cavity  size was normal. Wall thickness was   increased in a pattern of mild LVH. Systolic function was   moderately to severely reduced. The estimated ejection fraction   was in the range of 30% to 35%. Diffuse hypokinesis. - Aortic valve: There was mild regurgitation. - Mitral valve: There was mild to moderate regurgitation. - Left atrium: The atrium was moderately dilated. - Right ventricle: The cavity size was mildly dilated. - Right atrium: The atrium was moderately dilated.  Impressions:  - LV function difficult to quantitate due to tachycardia (HR 140);   moderate to severe global reduction in LV systolic function;   moderate biatrial enlargement; mild AI; mild to moderate MR; mild   RVE; mild TR.  ------------------------------------------------------------------- Study data:  Comparison was made to the study of 06/15/2013.  Study status:  Routine.  Procedure:  The patient reported no pain pre or post test. Transthoracic echocardiography. Image quality was adequate.          Transthoracic echocardiography.  M-mode, complete 2D, spectral Doppler, and color Doppler.  Birthdate: Patient birthdate: [birth year].  Age:  Patient is 61 yr old.  Sex: Gender: male.  BMI: 25.5 kg/m^2.  Blood pressure:     148/87 Patient status:  Outpatient.  Study date:  Study date: 04/20/2016. Study time: 09:32 AM.  Location:  [facility]  -------------------------------------------------------------------  ------------------------------------------------------------------- Left ventricle:  The cavity size was normal. Wall thickness was increased in a pattern of mild LVH. Systolic function was moderately to severely reduced. The estimated ejection fraction was in the range of 30% to 35%. Diffuse hypokinesis. The study was not technically sufficient to allow evaluation of LV diastolic dysfunction due to atrial  fibrillation.  ------------------------------------------------------------------- Aortic valve:   Trileaflet; mildly thickened leaflets. Mobility was not restricted.  Doppler:  Transvalvular velocity was within the normal range. There was no stenosis. There was mild regurgitation.   ------------------------------------------------------------------- Aorta:  Aortic root: The aortic root was normal in size.  ------------------------------------------------------------------- Mitral valve:   Structurally normal valve.   Mobility was not restricted.  Doppler:  Transvalvular velocity was within the normal range. There was no evidence for stenosis. There was mild to moderate regurgitation.    Peak gradient (D): 3 mm Hg.  ------------------------------------------------------------------- Left atrium:  The atrium was moderately dilated.  ------------------------------------------------------------------- Right ventricle:  The cavity size was mildly dilated. Systolic function was normal.  ------------------------------------------------------------------- Pulmonic valve:    Doppler:  Transvalvular velocity was within the normal range. There was no evidence for stenosis.  ------------------------------------------------------------------- Tricuspid valve:   Structurally normal valve.    Doppler: Transvalvular velocity was within the normal range. There was mild regurgitation.  ------------------------------------------------------------------- Pulmonary artery:   Systolic pressure was within the normal range.   ------------------------------------------------------------------- Right atrium:  The atrium was moderately dilated.  ------------------------------------------------------------------- Pericardium:  There was no pericardial effusion.  ------------------------------------------------------------------- Systemic veins: Inferior vena cava: The vessel was normal in  size. The respirophasic diameter changes were in the normal range (= 50%), consistent with normal central venous pressure. Diameter: 20 mm.  ------------------------------------------------------------------- Measurements   IVC                                         Value        Reference  ID                                          20    mm     ---------    Left ventricle                              Value        Reference  LV ID, ED, PLAX chordal             (L)     42.3  mm     43 - 52  LV ID, ES, PLAX chordal                     36.2  mm     23 - 38  LV fx shortening, PLAX chordal      (L)     14    %      >=29  LV PW thickness, ED  11.5  mm     ---------  IVS/LV PW ratio, ED                         1.04         <=1.3  Stroke volume, 2D                           38    ml     ---------  Stroke volume/bsa, 2D                       17    ml/m^2 ---------  LV ejection fraction, 1-p A4C               33    %      ---------  LV end-diastolic volume, 2-p                128   ml     ---------  LV end-systolic volume, 2-p                 87    ml     ---------  LV ejection fraction, 2-p                   32    %      ---------  Stroke volume, 2-p                          41    ml     ---------  LV end-diastolic volume/bsa, 2-p            59    ml/m^2 ---------  LV end-systolic volume/bsa, 2-p             40    ml/m^2 ---------  Stroke volume/bsa, 2-p                      18.8  ml/m^2 ---------  LV e&', lateral                              9.1   cm/s   ---------  LV E/e&', lateral                            9.27         ---------  LV e&', medial                               8.77  cm/s   ---------  LV E/e&', medial                             9.62         ---------  LV e&', average                              8.94  cm/s   ---------  LV E/e&', average                            9.45         ---------  Ventricular septum                          Value         Reference  IVS thickness, ED                           12    mm     ---------    LVOT                                        Value        Reference  LVOT ID, S                                  21    mm     ---------  LVOT area                                   3.46  cm^2   ---------  LVOT peak velocity, S                       70.7  cm/s   ---------  LVOT mean velocity, S                       54.1  cm/s   ---------  LVOT VTI, S                                 11    cm     ---------    Aortic valve                                Value        Reference  Aortic regurg pressure half-time            206   ms     ---------    Aorta                                       Value        Reference  Aortic root ID, ED                          36    mm     ---------    Left atrium                                 Value        Reference  LA ID, A-P, ES                              41    mm     ---------  LA ID/bsa, A-P  1.88  cm/m^2 <=2.2  LA volume, S                                76    ml     ---------  LA volume/bsa, S                            34.9  ml/m^2 ---------  LA volume, ES, 1-p A4C                      78    ml     ---------  LA volume/bsa, ES, 1-p A4C                  35.8  ml/m^2 ---------  LA volume, ES, 1-p A2C                      73    ml     ---------  LA volume/bsa, ES, 1-p A2C                  33.5  ml/m^2 ---------    Mitral valve                                Value        Reference  Mitral E-wave peak velocity                 84.4  cm/s   ---------  Mitral deceleration time                    187   ms     150 - 230  Mitral peak gradient, D                     3     mm Hg  ---------  Mitral regurg VTI, PISA                     115   cm     ---------  Mitral ERO, PISA                            0.17  cm^2   ---------  Mitral regurg volume, PISA                  20    ml     ---------    Pulmonary arteries                          Value         Reference  PA pressure, S, DP                          28    mm Hg  <=30    Tricuspid valve                             Value        Reference  Tricuspid regurg peak velocity              250   cm/s   ---------  Tricuspid peak RV-RA gradient               25    mm Hg  ---------  Tricuspid maximal regurg velocity,          250   cm/s   ---------  PISA    Systemic veins                              Value        Reference  Estimated CVP                               3     mm Hg  ---------    Right ventricle                             Value        Reference  RV pressure, S, DP                          28    mm Hg  <=30  RV s&', lateral, S                           8.33  cm/s   ---------  Legend: (L)  and  (H)  mark values outside specified reference range.  ------------------------------------------------------------------- Prepared and Electronically Authenticated by  Olga Millers 2017-12-05T10:58:50 PACS Images   Show images for ECHOCARDIOGRAM COMPLETE Patient Information   Patient Name Tyler Herrera, Tyler Herrera Sex Male DOB [birth year] SSN [id number] Reason For Exam  Priority: Routine  Dx: Persistent atrial fibrillation (HCC) (I48.1 (ICD-10-CM)) Surgical History   Surgical History    No past medical history on file.  Other Surgical History    Procedure Laterality Date Comment Source none    Provider  Performing Technologist/Nurse   Performing Technologist/Nurse: Alesia Morin          Implants     No active implants to display in this view. Order-Level Documents:   There are no order-level documents.  Encounter-Level Documents - 04/20/2016:   Scan on 04/23/2016 12:56 PM by Provider Default, MDScan on 04/23/2016 12:56 PM by Provider Default, MD  Electronic signature on 04/20/2016 9:20 AM  Electronic signature on 04/20/2016 9:20 AM    Signed   Electronically signed by Lewayne Bunting, MD on 04/20/16 at 1058 EST Printable Result Report    Result Report    Reproductive/Obstetrics                           Anesthesia Physical Anesthesia Plan  ASA: III  Anesthesia Plan: General   Post-op Pain Management:    Induction: Intravenous  Airway Management Planned: Natural Airway and Simple Face Mask  Additional Equipment:   Intra-op Plan:   Post-operative Plan:   Informed Consent: I have reviewed the patients History and Physical, chart, labs and discussed the procedure including the risks, benefits and alternatives for the proposed anesthesia with the patient or authorized representative who has indicated his/her understanding and acceptance.   Dental advisory given  Plan Discussed with: CRNA, Anesthesiologist and Surgeon  Anesthesia Plan Comments:        Anesthesia Quick Evaluation

## 2016-08-13 ENCOUNTER — Encounter (HOSPITAL_COMMUNITY): Payer: Self-pay | Admitting: Cardiovascular Disease

## 2016-08-13 DIAGNOSIS — I519 Heart disease, unspecified: Secondary | ICD-10-CM

## 2016-08-13 DIAGNOSIS — I428 Other cardiomyopathies: Secondary | ICD-10-CM

## 2016-08-13 LAB — BASIC METABOLIC PANEL
Anion gap: 8 (ref 5–15)
BUN: 24 mg/dL — AB (ref 6–20)
CALCIUM: 8.9 mg/dL (ref 8.9–10.3)
CO2: 24 mmol/L (ref 22–32)
CREATININE: 1.14 mg/dL (ref 0.61–1.24)
Chloride: 107 mmol/L (ref 101–111)
GFR calc Af Amer: >60 mL/min (ref >60)
GLUCOSE: 108 mg/dL — AB (ref 65–99)
Potassium: 4.1 mmol/L (ref 3.5–5.1)
SODIUM: 139 mmol/L (ref 135–145)

## 2016-08-13 LAB — MAGNESIUM: MAGNESIUM: 2.1 mg/dL (ref 1.7–2.4)

## 2016-08-13 MED ORDER — CARVEDILOL 3.125 MG PO TABS: 3.1250 mg | ORAL_TABLET | Freq: Two times a day (BID) | ORAL | 3 refills | Status: DC

## 2016-08-13 MED ORDER — LOSARTAN POTASSIUM 25 MG PO TABS: 25.0000 mg | ORAL_TABLET | Freq: Every day | ORAL | 3 refills | Status: DC

## 2016-08-13 MED ORDER — SOTALOL HCL 120 MG PO TABS: 120.0000 mg | ORAL_TABLET | Freq: Two times a day (BID) | ORAL | 3 refills | Status: DC

## 2016-08-13 NOTE — Discharge Instructions (Signed)
Information on my medicine - XARELTO® (Rivaroxaban) ° °This medication education was reviewed with me or my healthcare representative as part of my discharge preparation. ° °Why was Xarelto® prescribed for you? °Xarelto® was prescribed for you to reduce the risk of a blood clot forming that can cause a stroke if you have a medical condition called atrial fibrillation (a type of irregular heartbeat). ° °What do you need to know about xarelto® ? °Take your Xarelto® ONCE DAILY at the same time every day with your evening meal. °If you have difficulty swallowing the tablet whole, you may crush it and mix in applesauce just prior to taking your dose. ° °Take Xarelto® exactly as prescribed by your doctor and DO NOT stop taking Xarelto® without talking to the doctor who prescribed the medication.  Stopping without other stroke prevention medication to take the place of Xarelto® may increase your risk of developing a clot that causes a stroke.  Refill your prescription before you run out. ° °After discharge, you should have regular check-up appointments with your healthcare provider that is prescribing your Xarelto®.  In the future your dose may need to be changed if your kidney function or weight changes by a significant amount. ° °What do you do if you miss a dose? °If you are taking Xarelto® ONCE DAILY and you miss a dose, take it as soon as you remember on the same day then continue your regularly scheduled once daily regimen the next day. Do not take two doses of Xarelto® at the same time or on the same day.  ° °Important Safety Information °A possible side effect of Xarelto® is bleeding. You should call your healthcare provider right away if you experience any of the following: °? Bleeding from an injury or your nose that does not stop. °? Unusual colored urine (red or dark brown) or unusual colored stools (red or black). °? Unusual bruising for unknown reasons. °? A serious fall or if you hit your head (even if there  is no bleeding). ° °Some medicines may interact with Xarelto® and might increase your risk of bleeding while on Xarelto®. To help avoid this, consult your healthcare provider or pharmacist prior to using any new prescription or non-prescription medications, including herbals, vitamins, non-steroidal anti-inflammatory drugs (NSAIDs) and supplements. ° °This website has more information on Xarelto®: www.xarelto.com. ° ° ° °You have an appointment set up with the Atrial Fibrillation Clinic.  Multiple studies have shown that being followed by a dedicated atrial fibrillation clinic in addition to the standard care you receive from your other physicians improves health. We believe that enrollment in the atrial fibrillation clinic will allow us to better care for you.  ° °The phone number to the Atrial Fibrillation Clinic is [phone]. The clinic is staffed Monday through Friday from 8:30am to 5pm. ° °Parking Directions: The clinic is located in the Heart and Vascular Building connected to [facility]. °1)From [address] turn on to [address] and go to the 3rd entrance  (Heart and Vascular entrance) on the right. °2)Look to the right for Heart &Vascular Parking Garage. °3)A code for the entrance is required please call the clinic to receive this.   °4)Take the elevators to the 1st floor. Registration is in the room with the glass walls at the end of the hallway. ° °If you have any trouble parking or locating the clinic, please don’t hesitate to call [phone]. °

## 2016-08-13 NOTE — Progress Notes (Signed)
Patient was cleared for discharge this morning by Dr. Johney Frame pending post AM Sotalol EKG today if QTc stable. EKG is reviewed and QTc is stable Telemetry with SB/SR 50's-60's, no arrhythmias BP stable  I discussed medicine changes with the patient Patient is being discharged as planned.  Francis Dowse, PA-C

## 2016-08-13 NOTE — Progress Notes (Signed)
Doing well s/p cardioversion Remains in sinus rhythm  Will repeat ekg after am dose of sotalol.  If stable, will discharge to home. Follow-up appointments have been made.  Hillis Range MD, [facility] 08/13/2016 7:07 AM

## 2016-08-17 ENCOUNTER — Ambulatory Visit (HOSPITAL_COMMUNITY)
Admission: RE | Admit: 2016-08-17 | Discharge: 2016-08-17 | Disposition: A | Discharge status: Home / Self Care | Payer: Managed Care, Other (non HMO) | Source: Ambulatory Visit | Attending: Nurse Practitioner | Admitting: Nurse Practitioner

## 2016-08-17 ENCOUNTER — Encounter (HOSPITAL_COMMUNITY): Payer: Managed Care, Other (non HMO) | Admitting: Nurse Practitioner

## 2016-08-17 ENCOUNTER — Encounter (HOSPITAL_COMMUNITY): Payer: Self-pay | Admitting: Nurse Practitioner

## 2016-08-17 VITALS — BP 126/80 | HR 53 | Ht 74.0 in | Wt 196.2 lb

## 2016-08-17 DIAGNOSIS — Z87442 Personal history of urinary calculi: Secondary | ICD-10-CM | POA: Insufficient documentation

## 2016-08-17 DIAGNOSIS — E785 Hyperlipidemia, unspecified: Secondary | ICD-10-CM | POA: Diagnosis not present

## 2016-08-17 DIAGNOSIS — Z7901 Long term (current) use of anticoagulants: Secondary | ICD-10-CM | POA: Insufficient documentation

## 2016-08-17 DIAGNOSIS — Z79899 Other long term (current) drug therapy: Secondary | ICD-10-CM | POA: Insufficient documentation

## 2016-08-17 DIAGNOSIS — I481 Persistent atrial fibrillation: Secondary | ICD-10-CM | POA: Insufficient documentation

## 2016-08-17 DIAGNOSIS — I4819 Other persistent atrial fibrillation: Secondary | ICD-10-CM

## 2016-08-17 LAB — BASIC METABOLIC PANEL
ANION GAP: 8 (ref 5–15)
BUN: 18 mg/dL (ref 6–20)
CALCIUM: 9.4 mg/dL (ref 8.9–10.3)
CO2: 25 mmol/L (ref 22–32)
Chloride: 105 mmol/L (ref 101–111)
Creatinine, Ser: 1.1 mg/dL (ref 0.61–1.24)
GLUCOSE: 89 mg/dL (ref 65–99)
POTASSIUM: 4.6 mmol/L (ref 3.5–5.1)
Sodium: 138 mmol/L (ref 135–145)

## 2016-08-17 LAB — MAGNESIUM: Magnesium: 2.2 mg/dL (ref 1.7–2.4)

## 2016-08-17 NOTE — Progress Notes (Signed)
Primary Care Physician: Tyler Fillers, MD Referring Physician: Dr. [name] is a 61 y.o. male with a h/o persistent afib, with new onset 05/2013, that is in the afib clinic for follow up of echo ordered by Dr.Allred on office visit 04/05/16. It did show decline of EF to 30-35% from previous normal echo in 2015. In the office today his HR is 144. He does not feel the increased heart rate, asymptomatic. He has stopped drinking alcohol and has been on ASA only due to his preference. No significant snoring/apnea per wife. Minimal caffeine. He has been offered DOAC's/anticoagulants in the past and deferred, did not want extra meds.  He was changed from cardizem to metoprolol for lv dysfunction and better rate control. He as also stared on DOAC's until  definite treatment could be determined, with a CHA2DS2VASc score of 1( lv dysfunction). We discussed restoration of SR with antiarrythmic on last visit. The cost of tikosyn has been determined to be too costly for the pt to use long term. Sotalol is an option but the pt does not feel bad and really other that rate control does not see the need to return SR. His last echo was performed in the setting of  RVR and possible EF looked worse than it actually is. He does have better rate control today but still not optimal.  Pt has a f/u in the afib clinic after hospitalization for sotalol loading. He was loaded on the drug with stable qtc and had a successful cardioversion.Noted mild nausea over the weekend but feels very well today.  Today, he denies symptoms of palpitations, chest pain, shortness of breath, orthopnea, PND, lower extremity edema, dizziness, presyncope, syncope, or neurologic sequela. The patient is tolerating medications without difficulties and is otherwise without complaint today.   Past Medical History:  Diagnosis Date  . Hyperlipidemia   . Nephrolithiasis   . Persistent atrial fibrillation (HCC) 05/31/2013    Past Surgical History:  Procedure Laterality Date  . CARDIOVERSION N/A 08/12/2016   Procedure: CARDIOVERSION;  Surgeon: Wendall Stade, MD;  Location: [facility] ENDOSCOPY;  Service: Cardiovascular;  Laterality: N/A;  . none      Current Outpatient Prescriptions  Medication Sig Dispense Refill  . carvedilol (COREG) 3.125 MG tablet Take 1 tablet (3.125 mg total) by mouth 2 (two) times daily. 60 tablet 3  . losartan (COZAAR) 25 MG tablet Take 1 tablet (25 mg total) by mouth daily. 30 tablet 3  . Multiple Vitamin (MULTIVITAMIN) capsule Take 1 capsule by mouth daily. (Centrum)    . rivaroxaban (XARELTO) 20 MG TABS tablet Take 1 tablet (20 mg total) by mouth daily with supper. 30 tablet 0  . sotalol (BETAPACE) 120 MG tablet Take 1 tablet (120 mg total) by mouth every 12 (twelve) hours. 60 tablet 3   No current facility-administered medications for this encounter.     No Known Allergies  Social History   Social History  . Marital status: Unknown    Spouse name: N/A  . Number of children: N/A  . Years of education: N/A   Occupational History  . Not on file.   Social History Main Topics  . Smoking status: Never Smoker  . Smokeless tobacco: Never Used  . Alcohol use 4.2 oz/week    7 Cans of beer per week     Comment: 1 beer a day, 3-5 beers on the weekends  . Drug use: No  . Sexual activity: Not on file  Other Topics Concern  . Not on file   Social History Narrative   Pt lives in [place] in [place].   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 CAD      < 55   . Hypertension      ROS- All systems are reviewed and negative except as per the HPI above  Physical Exam: Vitals:   08/17/16 1328  BP: 126/80  Pulse: (!) 53  Weight: 196 lb 3.2 oz (89 kg)  Height: 6\' 2"  (1.88 m)    GEN- The patient is well appearing, alert and oriented x 3 today.   Head- normocephalic, atraumatic Eyes-  Sclera  clear, conjunctiva pink Ears- hearing intact Oropharynx- clear Neck- supple, no JVP Lymph- no cervical lymphadenopathy Lungs- Clear to ausculation bilaterally, normal work of breathing Heart- regular rate and rhythm, no murmurs, rubs or gallops, PMI not laterally displaced GI- soft, NT, ND, + BS Extremities- no clubbing, cyanosis, or edema MS- no significant deformity or atrophy Skin- no rash or lesion Psych- euthymic mood, full affect Neuro- strength and sensation are intact  EKG-afib at 112 bpm, qrs int 90 ms, qtc 477 ms  Epic records reviewed Echo-Study Conclusions  - Left ventricle: The cavity size was normal. Wall thickness was   increased in a pattern of mild LVH. Systolic function was   moderately to severely reduced. The estimated ejection fraction   was in the range of 30% to 35%. Diffuse hypokinesis. - Aortic valve: There was mild regurgitation. - Mitral valve: There was mild to moderate regurgitation. - Left atrium: The atrium was moderately dilated. 41 mm - Right ventricle: The cavity size was mildly dilated. - Right atrium: The atrium was moderately dilated.  Impressions:  - LV function difficult to quantitate due to tachycardia (HR 140);   moderate to severe global reduction in LV systolic function;   moderate biatrial enlargement; mild AI; mild to moderate MR; mild   RVE; mild TR.   Assessment and Plan:  1. 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 Johney Frame 4/30 f/u as needed afib clinic    [facility] Her[facility] [address] [place], [place] [address] [phone]

## 2016-08-19 ENCOUNTER — Encounter (HOSPITAL_COMMUNITY): Payer: Managed Care, Other (non HMO) | Admitting: Nurse Practitioner

## 2016-09-07 ENCOUNTER — Other Ambulatory Visit: Payer: Self-pay | Admitting: *Deleted

## 2016-09-07 MED ORDER — SOTALOL HCL 120 MG PO TABS: 120.0000 mg | ORAL_TABLET | Freq: Two times a day (BID) | ORAL | 3 refills | Status: DC

## 2016-09-07 MED ORDER — CARVEDILOL 3.125 MG PO TABS: 3.1250 mg | ORAL_TABLET | Freq: Two times a day (BID) | ORAL | 3 refills | Status: DC

## 2016-09-07 MED ORDER — LOSARTAN POTASSIUM 25 MG PO TABS: 25.0000 mg | ORAL_TABLET | Freq: Every day | ORAL | 3 refills | Status: DC

## 2016-09-13 ENCOUNTER — Ambulatory Visit (INDEPENDENT_AMBULATORY_CARE_PROVIDER_SITE_OTHER): Payer: Managed Care, Other (non HMO) | Admitting: Internal Medicine

## 2016-09-13 ENCOUNTER — Encounter: Payer: Self-pay | Admitting: Internal Medicine

## 2016-09-13 VITALS — BP 120/68 | HR 51 | Ht 74.0 in | Wt 192.0 lb

## 2016-09-13 DIAGNOSIS — I10 Essential (primary) hypertension: Secondary | ICD-10-CM | POA: Diagnosis not present

## 2016-09-13 DIAGNOSIS — I481 Persistent atrial fibrillation: Secondary | ICD-10-CM

## 2016-09-13 DIAGNOSIS — I4819 Other persistent atrial fibrillation: Secondary | ICD-10-CM

## 2016-09-13 DIAGNOSIS — I5023 Acute on chronic systolic (congestive) heart failure: Secondary | ICD-10-CM

## 2016-09-13 NOTE — Progress Notes (Signed)
Primary Care / Referring Physician: Garlan Fillers, MD  Tyler Herrera is a 61 y.o. male with a h/o persistent atrial fibrillation and chronic systolic dysfunction who presents today for EP follow-up.  He is doing well since initiation of sotalol.  He remains in sinus rhythm.   SOB and exercise tolerance are both improved.  He is not drinking alcohol. Today, he denies symptoms of chest pain,  orthopnea, PND, lower extremity edema, dizziness, presyncope, syncope, or neurologic sequela. The patient is tolerating medications without difficulties and is otherwise without complaint today.   Past Medical History:  Diagnosis Date  . Hyperlipidemia   . Nephrolithiasis   . Persistent atrial fibrillation (HCC) 05/31/2013   Past Surgical History:  Procedure Laterality Date  . CARDIOVERSION N/A 08/12/2016   Procedure: CARDIOVERSION;  Surgeon: Wendall Stade, MD;  Location: [facility] ENDOSCOPY;  Service: Cardiovascular;  Laterality: N/A;  . none      Current Outpatient Prescriptions  Medication Sig Dispense Refill  . carvedilol (COREG) 3.125 MG tablet Take 1 tablet (3.125 mg total) by mouth 2 (two) times daily. 180 tablet 3  . losartan (COZAAR) 25 MG tablet Take 1 tablet (25 mg total) by mouth daily. 90 tablet 3  . Multiple Vitamin (MULTIVITAMIN) capsule Take 1 capsule by mouth daily. (Centrum)    . rivaroxaban (XARELTO) 20 MG TABS tablet Take 1 tablet (20 mg total) by mouth daily with supper. 30 tablet 0  . sotalol (BETAPACE) 120 MG tablet Take 1 tablet (120 mg total) by mouth every 12 (twelve) hours. 180 tablet 3   No current facility-administered medications for this visit.     No Known Allergies  Social History   Social History  . Marital status: Unknown    Spouse name: N/A  . Number of children: N/A  . Years of education: N/A   Occupational History  . Not on file.   Social History Main Topics  . Smoking status: Never Smoker  . Smokeless tobacco: Never Used  . Alcohol use 4.2  oz/week    7 Cans of beer per week     Comment: 1 beer a day, 3-5 beers on the weekends  . Drug use: No  . Sexual activity: Not on file   Other Topics Concern  . Not on file   Social History Narrative   Pt lives in [place] in [place].   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 CAD      < 55   . Hypertension      ROS- All systems are reviewed and negative except as per the HPI above   Physical Exam: Vitals:   09/13/16 1349  BP: 120/68  Pulse: (!) 51  SpO2: 98%  Weight: 192 lb (87.1 kg)  Height: 6\' 2"  (1.88 m)    GEN- The patient is well appearing, alert and oriented x 3 today.   Head- normocephalic, atraumatic Eyes-  Sclera clear, conjunctiva pink Ears- hearing intact Oropharynx- clear Neck- supple,  Lungs- Clear to ausculation bilaterally, normal work of breathing Heart- regular rhythm, no murmurs, rubs or gallops, PMI not laterally displaced GI- soft, NT, ND, + BS Extremities- no clubbing, cyanosis, or edema MS- no significant deformity or atrophy Skin- no rash or lesion  EKG today reveals sinus rhythm 51 bpm, Pr 180 msec, QRS 84 msec, Qtc 411 msec, otherwise normal ekg  Assessment and Plan:  1. Persistent atrial fibrillation Maintaining  sinus rhythm with sotalol bmet, mg today Continue anticoagulation If afib recurs, could consider ablation  2. HTN Stable No change required today  3. ETOH Avoidance encouraged He is not drinking currently  4. 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   Return to see me in 3 months  Hillis Range MD, [facility] 09/13/2016 2:05 PM

## 2016-09-13 NOTE — Patient Instructions (Signed)
Medication Instructions:  Your physician recommends that you continue on your current medications as directed. Please refer to the Current Medication list given to you today.   Labwork: Your physician recommends that you return for lab work today: BMP/MAG   Testing/Procedures: Your physician has requested that you have an echocardiogram. Echocardiography is a painless test that uses sound waves to create images of your heart. It provides your doctor with information about the size and shape of your heart and how well your heart's chambers and valves are working. This procedure takes approximately one hour. There are no restrictions for this procedure.---in 3 months prior to the [facility] appointment    Follow-Up: Your physician recommends that you schedule a follow-up appointment in: 3 months with Dr Johney Frame   Any Other Special Instructions Will Be Listed Below (If Applicable).     If you need a refill on your cardiac medications before your next appointment, please call your pharmacy.

## 2016-09-14 LAB — CBC WITH DIFFERENTIAL/PLATELET
Basophils Absolute: 0.1 10*3/uL (ref 0.0–0.2)
Basos: 1 %
EOS (ABSOLUTE): 0.2 10*3/uL (ref 0.0–0.4)
Eos: 3 %
HEMATOCRIT: 43.1 % (ref 37.5–51.0)
Hemoglobin: 14.9 g/dL (ref 13.0–17.7)
IMMATURE GRANS (ABS): 0 10*3/uL (ref 0.0–0.1)
Immature Granulocytes: 0 %
LYMPHS: 24 %
Lymphocytes Absolute: 1.5 10*3/uL (ref 0.7–3.1)
MCH: 30.4 pg (ref 26.6–33.0)
MCHC: 34.6 g/dL (ref 31.5–35.7)
MCV: 88 fL (ref 79–97)
MONOCYTES: 10 %
Monocytes Absolute: 0.6 10*3/uL (ref 0.1–0.9)
NEUTROS ABS: 3.9 10*3/uL (ref 1.4–7.0)
Neutrophils: 62 %
PLATELETS: 288 10*3/uL (ref 150–379)
RBC: 4.9 x10E6/uL (ref 4.14–5.80)
RDW: 13.6 % (ref 12.3–15.4)
WBC: 6.2 10*3/uL (ref 3.4–10.8)

## 2016-09-14 LAB — BASIC METABOLIC PANEL
BUN/Creatinine Ratio: 21 (ref 10–24)
BUN: 20 mg/dL (ref 8–27)
CALCIUM: 9.6 mg/dL (ref 8.6–10.2)
CO2: 26 mmol/L (ref 18–29)
CREATININE: 0.97 mg/dL (ref 0.76–1.27)
Chloride: 103 mmol/L (ref 96–106)
GFR calc non Af Amer: 84 mL/min/{1.73_m2} (ref >59)
GFR, EST AFRICAN AMERICAN: 97 mL/min/{1.73_m2} (ref >59)
Glucose: 92 mg/dL (ref 65–99)
Potassium: 4.6 mmol/L (ref 3.5–5.2)
Sodium: 142 mmol/L (ref 134–144)

## 2016-10-22 NOTE — Addendum Note (Signed)
Addendum  created 10/22/16 1131 by Navarre Diana, MD   Sign clinical note    

## 2016-11-01 ENCOUNTER — Encounter (HOSPITAL_COMMUNITY): Payer: Self-pay

## 2016-11-20 ENCOUNTER — Other Ambulatory Visit (HOSPITAL_COMMUNITY): Payer: Self-pay | Admitting: Nurse Practitioner

## 2016-12-03 ENCOUNTER — Ambulatory Visit (HOSPITAL_COMMUNITY): Payer: Managed Care, Other (non HMO) | Attending: Cardiovascular Disease

## 2016-12-03 ENCOUNTER — Other Ambulatory Visit: Payer: Self-pay

## 2016-12-03 DIAGNOSIS — I5030 Unspecified diastolic (congestive) heart failure: Secondary | ICD-10-CM | POA: Insufficient documentation

## 2016-12-03 DIAGNOSIS — I481 Persistent atrial fibrillation: Secondary | ICD-10-CM

## 2016-12-03 DIAGNOSIS — I420 Dilated cardiomyopathy: Secondary | ICD-10-CM | POA: Insufficient documentation

## 2016-12-03 DIAGNOSIS — I083 Combined rheumatic disorders of mitral, aortic and tricuspid valves: Secondary | ICD-10-CM | POA: Diagnosis not present

## 2016-12-03 DIAGNOSIS — I4819 Other persistent atrial fibrillation: Secondary | ICD-10-CM

## 2016-12-13 ENCOUNTER — Encounter: Payer: Self-pay | Admitting: Internal Medicine

## 2016-12-13 ENCOUNTER — Ambulatory Visit (INDEPENDENT_AMBULATORY_CARE_PROVIDER_SITE_OTHER): Payer: Managed Care, Other (non HMO) | Admitting: Internal Medicine

## 2016-12-13 VITALS — BP 122/86 | HR 46 | Ht 74.0 in | Wt 195.0 lb

## 2016-12-13 DIAGNOSIS — I481 Persistent atrial fibrillation: Secondary | ICD-10-CM

## 2016-12-13 DIAGNOSIS — I10 Essential (primary) hypertension: Secondary | ICD-10-CM | POA: Diagnosis not present

## 2016-12-13 DIAGNOSIS — I4819 Other persistent atrial fibrillation: Secondary | ICD-10-CM

## 2016-12-13 NOTE — Progress Notes (Signed)
   PCP: Jarome Matin, MD  Tyler Herrera is a 61 y.o. male who presents today for routine electrophysiology followup.  Since last being seen in our clinic, the patient reports doing very well.  He remains in sinus rhythm.  Very pleased with current health state.  EF has normalized with sinus rhythm.  Today, he denies symptoms of palpitations, chest pain, shortness of breath,  lower extremity edema, dizziness, presyncope, or syncope.  The patient is otherwise without complaint today.   Past Medical History:  Diagnosis Date  . Hyperlipidemia   . Nephrolithiasis   . Persistent atrial fibrillation (HCC) 05/31/2013   Past Surgical History:  Procedure Laterality Date  . CARDIOVERSION N/A 08/12/2016   Procedure: CARDIOVERSION;  Surgeon: Wendall Stade, MD;  Location: [facility] ENDOSCOPY;  Service: Cardiovascular;  Laterality: N/A;  . none      ROS- all systems are reviewed and negatives except as per HPI above  Current Outpatient Prescriptions  Medication Sig Dispense Refill  . carvedilol (COREG) 3.125 MG tablet Take 1 tablet (3.125 mg total) by mouth 2 (two) times daily. 180 tablet 3  . losartan (COZAAR) 25 MG tablet Take 1 tablet (25 mg total) by mouth daily. 90 tablet 3  . Multiple Vitamin (MULTIVITAMIN) capsule Take 1 capsule by mouth daily. (Centrum)    . sotalol (BETAPACE) 120 MG tablet Take 1 tablet (120 mg total) by mouth every 12 (twelve) hours. 180 tablet 3  . XARELTO 20 MG TABS tablet TAKE 1 TABLET BY MOUTH EVERY DAY WITH SUPPER 30 tablet 2   No current facility-administered medications for this visit.     Physical Exam: Vitals:   12/13/16 1613  BP: 122/86  Pulse: (!) 46  SpO2: 96%  Weight: 195 lb (88.5 kg)  Height: 6\' 2"  (1.88 m)    GEN- The patient is well appearing, alert and oriented x 3 today.   Head- normocephalic, atraumatic Eyes-  Sclera clear, conjunctiva pink Ears- hearing intact Oropharynx- clear Lungs- Clear to ausculation bilaterally, normal work of  breathing Heart- Regular rate and rhythm, no murmurs, rubs or gallops, PMI not laterally displaced GI- soft, NT, ND, + BS Extremities- no clubbing, cyanosis, or edema  EKG tracing ordered today is personally reviewed and shows sinus 46 bpm, PR 180 msec, Qtc 399 msec  Assessment and Plan:  1. Persistent afib Maintaining sinus rhythm with sotalol Continue anticoagulation Consider ablation if further afib chads2vasc score is 1 (HTN).  We discussed pros and cons to long term anticoagulation.  At this time, he is going to think about it but is leaning towards stopping xarelto.  2. HTN Well controlled  3. 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 Results of echo discussed with the patient today Stop coreg due to sinus bradycardia  Return to see me in 4 months  Hillis Range MD, [facility] 12/13/2016 4:29 PM

## 2016-12-13 NOTE — Patient Instructions (Signed)
Medication Instructions:  Your physician has recommended you make the following change in your medication:  1) STOP Coreg  Labwork: none  Testing/Procedures: none  Follow-Up: Your physician recommends that you schedule a follow-up appointment in: 4 months with Dr. Johney Frame.   Any Other Special Instructions Will Be Listed Below (If Applicable).     If you need a refill on your cardiac medications before your next appointment, please call your pharmacy.

## 2017-03-28 ENCOUNTER — Encounter: Payer: Self-pay | Admitting: Internal Medicine

## 2017-04-06 ENCOUNTER — Ambulatory Visit (INDEPENDENT_AMBULATORY_CARE_PROVIDER_SITE_OTHER): Payer: Managed Care, Other (non HMO) | Admitting: Internal Medicine

## 2017-04-06 ENCOUNTER — Encounter: Payer: Self-pay | Admitting: Internal Medicine

## 2017-04-06 VITALS — BP 128/82 | HR 51 | Ht 74.0 in | Wt 198.4 lb

## 2017-04-06 DIAGNOSIS — I10 Essential (primary) hypertension: Secondary | ICD-10-CM | POA: Diagnosis not present

## 2017-04-06 DIAGNOSIS — I481 Persistent atrial fibrillation: Secondary | ICD-10-CM

## 2017-04-06 DIAGNOSIS — I4819 Other persistent atrial fibrillation: Secondary | ICD-10-CM

## 2017-04-06 NOTE — Patient Instructions (Signed)
Medication Instructions:  Your physician recommends that you continue on your current medications as directed. Please refer to the Current Medication list given to you today.  -- If you need a refill on your cardiac medications before your next appointment, please call your pharmacy. --  Labwork: TODAY:  BMET/MAGNESIUM  Testing/Procedures: None ordered  Follow-Up: Your physician wants you to follow-up in: 6 months with Dr. Johney Frame.    You will receive a reminder letter in the mail two months in advance. If you don't receive a letter, please call our office to schedule the follow-up appointment.  Thank you for choosing [facility]!!   Sigurd Sos, RN [phone]  Any Other Special Instructions Will Be Listed Below (If Applicable).

## 2017-04-06 NOTE — Progress Notes (Signed)
   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  Since last being seen in our clinic, the patient reports doing very well.  Today, he denies symptoms of palpitations, chest pain, shortness of breath,  lower extremity edema, dizziness, presyncope, or syncope.  The patient is otherwise without complaint today.   Past Medical History:  Diagnosis Date  . Hyperlipidemia   . Nephrolithiasis   . Persistent atrial fibrillation (HCC) 05/31/2013   Past Surgical History:  Procedure Laterality Date  . CARDIOVERSION N/A 08/12/2016   Procedure: CARDIOVERSION;  Surgeon: Wendall Stade, MD;  Location: [facility] ENDOSCOPY;  Service: Cardiovascular;  Laterality: N/A;  . none      ROS- all systems are reviewed and negatives except as per HPI above  Current Outpatient Medications  Medication Sig Dispense Refill  . losartan (COZAAR) 25 MG tablet Take 1 tablet (25 mg total) by mouth daily. 90 tablet 3  . Multiple Vitamin (MULTIVITAMIN) capsule Take 1 capsule by mouth daily. (Centrum)    . sotalol (BETAPACE) 120 MG tablet Take 1 tablet (120 mg total) by mouth every 12 (twelve) hours. 180 tablet 3   No current facility-administered medications for this visit.     Physical Exam: Vitals:   04/06/17 1603  Height: 6\' 2"  (1.88 m)    GEN- The patient is well appearing, alert and oriented x 3 today.   Head- normocephalic, atraumatic Eyes-  Sclera clear, conjunctiva pink Ears- hearing intact Oropharynx- clear Lungs- Clear to ausculation bilaterally, normal work of breathing Heart- Regular rate and rhythm, no murmurs, rubs or gallops, PMI not laterally displaced GI- soft, NT, ND, + BS Extremities- no clubbing, cyanosis, or edema  EKG tracing ordered today is personally reviewed and shows sinus rhythm 51 bpm, Qtc 414 msec  Assessment and Plan:  1. Persistent atrial fibrillation Doing well with sotalol No  changes Bmet, mg today chads2vasc score is 1.  We have had a shared decision making discussion again today and have decided together to not anticoagulate long term  2. HTN Stable No change required today  3. Chronic systolic dysfunction EF has recovered with sinus rhythm  Return to see me in 6 months  Hillis Range MD, [facility] 04/06/2017 4:09 PM

## 2017-04-07 LAB — BASIC METABOLIC PANEL
BUN / CREAT RATIO: 22 (ref 10–24)
BUN: 19 mg/dL (ref 8–27)
CHLORIDE: 106 mmol/L (ref 96–106)
CO2: 23 mmol/L (ref 20–29)
CREATININE: 0.87 mg/dL (ref 0.76–1.27)
Calcium: 9.7 mg/dL (ref 8.6–10.2)
GFR calc Af Amer: 108 mL/min/{1.73_m2} (ref >59)
GFR, EST NON AFRICAN AMERICAN: 93 mL/min/{1.73_m2} (ref >59)
Glucose: 95 mg/dL (ref 65–99)
Potassium: 4.3 mmol/L (ref 3.5–5.2)
Sodium: 143 mmol/L (ref 134–144)

## 2017-04-07 LAB — MAGNESIUM: MAGNESIUM: 2.3 mg/dL (ref 1.6–2.3)

## 2017-09-11 ENCOUNTER — Other Ambulatory Visit: Payer: Self-pay | Admitting: Internal Medicine

## 2017-12-05 ENCOUNTER — Telehealth: Payer: Managed Care, Other (non HMO) | Admitting: Family

## 2017-12-05 ENCOUNTER — Telehealth: Payer: Self-pay | Admitting: Internal Medicine

## 2017-12-05 DIAGNOSIS — J028 Acute pharyngitis due to other specified organisms: Secondary | ICD-10-CM

## 2017-12-05 DIAGNOSIS — B9689 Other specified bacterial agents as the cause of diseases classified elsewhere: Secondary | ICD-10-CM

## 2017-12-05 MED ORDER — BENZONATATE 100 MG PO CAPS: 100.0000 mg | ORAL_CAPSULE | Freq: Three times a day (TID) | ORAL | 0 refills | Status: DC | PRN

## 2017-12-05 MED ORDER — DOXYCYCLINE HYCLATE 100 MG PO TABS: 100.0000 mg | ORAL_TABLET | Freq: Two times a day (BID) | ORAL | 0 refills | Status: DC

## 2017-12-05 NOTE — Telephone Encounter (Signed)
New Message:       Pt's wife is calling and states he has a sinus infection and she would like to know is it okay to take and antibiotic with all of the medications he currently takes.

## 2017-12-05 NOTE — Telephone Encounter (Signed)
Left a message for the pt/EC to call the office back and ask to speak with a triage nurse.

## 2017-12-05 NOTE — Progress Notes (Signed)
Thank you for the details you included in the comment boxes. Those details are very helpful in determining the best course of treatment for you and help [place] to provide the best care. It says cough but I modified the plan for cough AND sinus. This will treat both.   We are sorry that you are not feeling well.  Here is how we plan to help!  Based on your presentation I believe you most likely have A cough due to bacteria.  When patients have a fever and a productive cough with a change in color or increased sputum production, we are concerned about bacterial bronchitis.  If left untreated it can progress to pneumonia.  If your symptoms do not improve with your treatment plan it is important that you contact your provider.   I have prescribed Doxycycline 100 mg twice a day for 7 days     In addition you may use A non-prescription cough medication called Mucinex DM: take 2 tablets every 12 hours. and A prescription cough medication called Tessalon Perles 100mg . You may take 1-2 capsules every 8 hours as needed for your cough.    From your responses in the eVisit questionnaire you describe inflammation in the upper respiratory tract which is causing a significant cough.  This is commonly called Bronchitis and has four common causes:    Allergies  Viral Infections  Acid Reflux  Bacterial Infection Allergies, viruses and acid reflux are treated by controlling symptoms or eliminating the cause. An example might be a cough caused by taking certain blood pressure medications. You stop the cough by changing the medication. Another example might be a cough caused by acid reflux. Controlling the reflux helps control the cough.  USE OF BRONCHODILATOR ("RESCUE") INHALERS: There is a risk from using your bronchodilator too frequently.  The risk is that over-reliance on a medication which only relaxes the muscles surrounding the breathing tubes can reduce the effectiveness of medications prescribed to reduce  swelling and congestion of the tubes themselves.  Although you feel brief relief from the bronchodilator inhaler, your asthma may actually be worsening with the tubes becoming more swollen and filled with mucus.  This can delay other crucial treatments, such as oral steroid medications. If you need to use a bronchodilator inhaler daily, several times per day, you should discuss this with your provider.  There are probably better treatments that could be used to keep your asthma under control.     HOME CARE . Only take medications as instructed by your medical team. . Complete the entire course of an antibiotic. . Drink plenty of fluids and get plenty of rest. . Avoid close contacts especially the very young and the elderly . Cover your mouth if you cough or cough into your sleeve. . Always remember to wash your hands . A steam or ultrasonic humidifier can help congestion.   GET HELP RIGHT AWAY IF: . You develop worsening fever. . You become short of breath . You cough up blood. . Your symptoms persist after you have completed your treatment plan MAKE SURE YOU   Understand these instructions.  Will watch your condition.  Will get help right away if you are not doing well or get worse.  Your e-visit answers were reviewed by a board certified advanced clinical practitioner to complete your personal care plan.  Depending on the condition, your plan could have included both over the counter or prescription medications. If there is a problem please reply  once  you have received a response from your provider. Your safety is important to [place].  If you have drug allergies check your prescription carefully.    You can use MyChart to ask questions about today's visit, request a non-urgent call back, or ask for a work or school excuse for 24 hours related to this e-Visit. If it has been greater than 24 hours you will need to follow up with your provider, or enter a new e-Visit to address those  concerns. You will get an e-mail in the next two days asking about your experience.  I hope that your e-visit has been valuable and will speed your recovery. Thank you for using e-visits.

## 2018-03-21 ENCOUNTER — Other Ambulatory Visit: Payer: Self-pay | Admitting: Internal Medicine

## 2018-04-03 ENCOUNTER — Encounter: Payer: Self-pay | Admitting: Internal Medicine

## 2018-04-03 ENCOUNTER — Ambulatory Visit (INDEPENDENT_AMBULATORY_CARE_PROVIDER_SITE_OTHER): Payer: Managed Care, Other (non HMO) | Admitting: Internal Medicine

## 2018-04-03 VITALS — BP 134/76 | HR 47 | Ht 74.0 in | Wt 195.8 lb

## 2018-04-03 DIAGNOSIS — I10 Essential (primary) hypertension: Secondary | ICD-10-CM

## 2018-04-03 DIAGNOSIS — I5022 Chronic systolic (congestive) heart failure: Secondary | ICD-10-CM | POA: Diagnosis not present

## 2018-04-03 DIAGNOSIS — I4819 Other persistent atrial fibrillation: Secondary | ICD-10-CM

## 2018-04-03 LAB — BASIC METABOLIC PANEL
BUN/Creatinine Ratio: 17 (ref 10–24)
BUN: 17 mg/dL (ref 8–27)
CO2: 22 mmol/L (ref 20–29)
CREATININE: 1.02 mg/dL (ref 0.76–1.27)
Calcium: 9.8 mg/dL (ref 8.6–10.2)
Chloride: 102 mmol/L (ref 96–106)
GFR calc Af Amer: 91 mL/min/{1.73_m2} (ref >59)
GFR, EST NON AFRICAN AMERICAN: 78 mL/min/{1.73_m2} (ref >59)
Glucose: 92 mg/dL (ref 65–99)
Potassium: 4.8 mmol/L (ref 3.5–5.2)
SODIUM: 140 mmol/L (ref 134–144)

## 2018-04-03 LAB — MAGNESIUM: MAGNESIUM: 2.3 mg/dL (ref 1.6–2.3)

## 2018-04-03 MED ORDER — SOTALOL HCL 120 MG PO TABS: 120.0000 mg | ORAL_TABLET | Freq: Two times a day (BID) | ORAL | 3 refills | Status: DC

## 2018-04-03 MED ORDER — LOSARTAN POTASSIUM 25 MG PO TABS: 25.0000 mg | ORAL_TABLET | Freq: Every day | ORAL | 3 refills | Status: DC

## 2018-04-03 NOTE — Progress Notes (Signed)
   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  Since last being seen in our clinic, the patient reports doing very well.  Walks 4-5 miles per day without symptoms.  Today, he denies symptoms of palpitations, chest pain, shortness of breath,  lower extremity edema, dizziness, presyncope, or syncope.  The patient is otherwise without complaint today.   Past Medical History:  Diagnosis Date  . Hyperlipidemia   . Nephrolithiasis   . Persistent atrial fibrillation 05/31/2013   Past Surgical History:  Procedure Laterality Date  . CARDIOVERSION N/A 08/12/2016   Procedure: CARDIOVERSION;  Surgeon: Wendall Stade, MD;  Location: [facility] ENDOSCOPY;  Service: Cardiovascular;  Laterality: N/A;  . none      ROS- all systems are reviewed and negatives except as per HPI above  Current Outpatient Medications  Medication Sig Dispense Refill  . doxycycline (VIBRA-TABS) 100 MG tablet Take 1 tablet (100 mg total) by mouth 2 (two) times daily. 14 tablet 0  . losartan (COZAAR) 25 MG tablet Take 1 tablet (25 mg total) by mouth daily. Please make overdue appt with Dr. Johney Frame before anymore refills. 1st attempt 30 tablet 0  . Multiple Vitamin (MULTIVITAMIN) capsule Take 1 capsule by mouth daily. (Centrum)    . sotalol (BETAPACE) 120 MG tablet Take 1 tablet (120 mg total) by mouth every 12 (twelve) hours. Please make overdue appt with Dr. Johney Frame before anymore refills. 1st attempt 60 tablet 0   No current facility-administered medications for this visit.     Physical Exam: Vitals:   04/03/18 1018  BP: 134/76  Pulse: (!) 47  SpO2: 97%  Weight: 195 lb 12.8 oz (88.8 kg)  Height: 6\' 2"  (1.88 m)    GEN- The patient is well appearing, alert and oriented x 3 today.   Head- normocephalic, atraumatic Eyes-  Sclera clear, conjunctiva pink Ears- hearing intact Oropharynx- clear Lungs- Clear to ausculation  bilaterally, normal work of breathing Heart- Regular rate and rhythm, no murmurs, rubs or gallops, PMI not laterally displaced GI- soft, NT, ND, + BS Extremities- no clubbing, cyanosis, or edema  Wt Readings from Last 3 Encounters:  04/03/18 195 lb 12.8 oz (88.8 kg)  04/06/17 198 lb 6.4 oz (90 kg)  12/13/16 195 lb (88.5 kg)    EKG tracing ordered today is personally reviewed and shows sinus bradycardia 47 bpm, PR 192 msec, QRS 92 msec, Qtc 400 msec, otherwise normal ekg  Assessment and Plan:  1. Persistent afib Doing well with sotalol Bmet, mg today chads2vasc score is 1.  Per guidelines, he has decided to not take anticoagulation at this time.  2. HTN Stable No change required today  3. Chronic systolic dysfunction EF has recovered with sinus rhythm  4. Bradycardia Asymptomatic No changes  Return in 6 months to the AF clinic to see [facility] PA-C  Hillis Range MD, [facility] 04/03/2018 10:28 AM

## 2018-04-03 NOTE — Patient Instructions (Addendum)
Medication Instructions:  Your physician recommends that you continue on your current medications as directed. Please refer to the Current Medication list given to you today.  Labwork: You will get lab work today:  BMET and magnesium  Testing/Procedures: None ordered.  Follow-Up: Your physician wants you to follow-up in: 6 months with Ricky at the afib clinic.    You will receive a reminder letter in the mail two months in advance. If you don't receive a letter, please call our office to schedule the follow-up appointment.  Your physician wants you to follow-up in: one year with Dr. Johney Frame.   You will receive a reminder letter in the mail two months in advance. If you don't receive a letter, please call our office to schedule the follow-up appointment.  Any Other Special Instructions Will Be Listed Below (If Applicable).  If you need a refill on your cardiac medications before your next appointment, please call your pharmacy.

## 2018-12-04 ENCOUNTER — Telehealth: Payer: Self-pay | Admitting: Nurse Practitioner

## 2018-12-04 NOTE — Telephone Encounter (Signed)
Was asked by Dr. Acie Fredrickson to call patient and schedule appointment per request via email that he received Left detailed message on patient's voice mail to call back to schedule an appointment with Dr. Acie Fredrickson. Advised that he has an opening tomorrow or they may schedule a future date.

## 2018-12-06 NOTE — Telephone Encounter (Signed)
Left message for patient to call back to schedule an appointment with Dr. Acie Fredrickson

## 2018-12-15 ENCOUNTER — Ambulatory Visit (HOSPITAL_COMMUNITY)
Admit: 2018-12-15 | Discharge: 2018-12-15 | Disposition: A | Discharge status: Home / Self Care | Payer: BC Managed Care – PPO | Attending: Ophthalmology | Admitting: Ophthalmology

## 2018-12-15 ENCOUNTER — Encounter (HOSPITAL_COMMUNITY): Payer: Self-pay

## 2018-12-15 ENCOUNTER — Ambulatory Visit (HOSPITAL_COMMUNITY): Payer: BC Managed Care – PPO | Admitting: Anesthesiology

## 2018-12-15 ENCOUNTER — Encounter (HOSPITAL_COMMUNITY): Disposition: A | Discharge status: Home / Self Care | Payer: Self-pay | Attending: Ophthalmology

## 2018-12-15 DIAGNOSIS — I10 Essential (primary) hypertension: Secondary | ICD-10-CM | POA: Diagnosis not present

## 2018-12-15 DIAGNOSIS — I4819 Other persistent atrial fibrillation: Secondary | ICD-10-CM | POA: Insufficient documentation

## 2018-12-15 DIAGNOSIS — H33001 Unspecified retinal detachment with retinal break, right eye: Secondary | ICD-10-CM | POA: Insufficient documentation

## 2018-12-15 DIAGNOSIS — Z20828 Contact with and (suspected) exposure to other viral communicable diseases: Secondary | ICD-10-CM | POA: Insufficient documentation

## 2018-12-15 HISTORY — PX: REPAIR OF COMPLEX TRACTION RETINAL DETACHMENT: SHX6217

## 2018-12-15 HISTORY — DX: Personal history of urinary calculi: Z87.442

## 2018-12-15 HISTORY — PX: PARS PLANA VITRECTOMY: SHX2166

## 2018-12-15 LAB — CBC
HCT: 44.6 % (ref 39.0–52.0)
Hemoglobin: 15.3 g/dL (ref 13.0–17.0)
MCH: 30.2 pg (ref 26.0–34.0)
MCHC: 34.3 g/dL (ref 30.0–36.0)
MCV: 88.1 fL (ref 80.0–100.0)
Platelets: 361 10*3/uL (ref 150–400)
RBC: 5.06 MIL/uL (ref 4.22–5.81)
RDW: 11.9 % (ref 11.5–15.5)
WBC: 7.1 10*3/uL (ref 4.0–10.5)
nRBC: 0 % (ref 0.0–0.2)

## 2018-12-15 LAB — BASIC METABOLIC PANEL
Anion gap: 9 (ref 5–15)
BUN: 17 mg/dL (ref 8–23)
CO2: 23 mmol/L (ref 22–32)
Calcium: 9.4 mg/dL (ref 8.9–10.3)
Chloride: 104 mmol/L (ref 98–111)
Creatinine, Ser: 0.95 mg/dL (ref 0.61–1.24)
GFR calc Af Amer: >60 mL/min (ref >60)
GFR calc non Af Amer: >60 mL/min (ref >60)
Glucose, Bld: 101 mg/dL — ABNORMAL HIGH (ref 70–99)
Potassium: 3.8 mmol/L (ref 3.5–5.1)
Sodium: 136 mmol/L (ref 135–145)

## 2018-12-15 LAB — SARS CORONAVIRUS 2 BY RT PCR (HOSPITAL ORDER, PERFORMED IN ~~LOC~~ HOSPITAL LAB): SARS Coronavirus 2: NEGATIVE

## 2018-12-15 SURGERY — PARS PLANA VITRECTOMY WITH 25 GAUGE
Anesthesia: Monitor Anesthesia Care | Site: Eye | Laterality: Right

## 2018-12-15 MED ORDER — BSS PLUS IO SOLN
INTRAOCULAR | Status: AC
  Filled 2018-12-15: qty 500

## 2018-12-15 MED ORDER — PROPOFOL 10 MG/ML IV BOLUS
INTRAVENOUS | Status: DC | PRN
  Administered 2018-12-15: 40 mg via INTRAVENOUS

## 2018-12-15 MED ORDER — LIDOCAINE 2% (20 MG/ML) 5 ML SYRINGE
INTRAMUSCULAR | Status: AC
  Filled 2018-12-15: qty 5

## 2018-12-15 MED ORDER — ACETAMINOPHEN 10 MG/ML IV SOLN: 1000.0000 mg | Freq: Once | INTRAVENOUS | Status: DC | PRN

## 2018-12-15 MED ORDER — OFLOXACIN 0.3 % OP SOLN
OPHTHALMIC | Status: AC
  Administered 2018-12-15: 1 [drp] via OPHTHALMIC
  Filled 2018-12-15: qty 5

## 2018-12-15 MED ORDER — MEPERIDINE HCL 25 MG/ML IJ SOLN: 6.2500 mg | INTRAMUSCULAR | Status: DC | PRN

## 2018-12-15 MED ORDER — HYPROMELLOSE (GONIOSCOPIC) 2.5 % OP SOLN
OPHTHALMIC | Status: AC
  Filled 2018-12-15: qty 15

## 2018-12-15 MED ORDER — INDOCYANINE GREEN 25 MG IV SOLR
INTRAVENOUS | Status: AC
  Filled 2018-12-15: qty 25

## 2018-12-15 MED ORDER — ATROPINE SULFATE 1 % OP SOLN
OPHTHALMIC | Status: AC
  Filled 2018-12-15: qty 5

## 2018-12-15 MED ORDER — CEFAZOLIN SUBCONJUNCTIVAL INJECTION 100 MG/0.5 ML
100.0000 mg | INJECTION | SUBCONJUNCTIVAL | Status: DC
  Filled 2018-12-15 (×3): qty 5

## 2018-12-15 MED ORDER — MIDAZOLAM HCL 2 MG/2ML IJ SOLN
INTRAMUSCULAR | Status: DC | PRN
  Administered 2018-12-15: 2 mg via INTRAVENOUS

## 2018-12-15 MED ORDER — EPINEPHRINE PF 1 MG/ML IJ SOLN
INTRAOCULAR | Status: DC | PRN
  Administered 2018-12-15: .3 mL

## 2018-12-15 MED ORDER — LIDOCAINE HCL (CARDIAC) PF 100 MG/5ML IV SOSY
PREFILLED_SYRINGE | INTRAVENOUS | Status: DC | PRN
  Administered 2018-12-15: 100 mg via INTRATRACHEAL

## 2018-12-15 MED ORDER — LIDOCAINE HCL 2 % IJ SOLN
INTRAMUSCULAR | Status: AC
  Filled 2018-12-15: qty 20

## 2018-12-15 MED ORDER — HYALURONIDASE HUMAN 150 UNIT/ML IJ SOLN
INTRAMUSCULAR | Status: AC
  Filled 2018-12-15: qty 1

## 2018-12-15 MED ORDER — BUPIVACAINE HCL (PF) 0.75 % IJ SOLN
INTRAMUSCULAR | Status: AC
  Filled 2018-12-15: qty 10

## 2018-12-15 MED ORDER — ARTIFICIAL TEARS OPHTHALMIC OINT
TOPICAL_OINTMENT | OPHTHALMIC | Status: AC
  Filled 2018-12-15: qty 3.5

## 2018-12-15 MED ORDER — PROPOFOL 10 MG/ML IV BOLUS
INTRAVENOUS | Status: AC
  Filled 2018-12-15: qty 20

## 2018-12-15 MED ORDER — DEXAMETHASONE SODIUM PHOSPHATE 10 MG/ML IJ SOLN
INTRAMUSCULAR | Status: AC
  Filled 2018-12-15: qty 1

## 2018-12-15 MED ORDER — CEFAZOLIN SUBCONJUNCTIVAL INJECTION 100 MG/0.5 ML
INJECTION | SUBCONJUNCTIVAL | Status: DC | PRN
  Administered 2018-12-15: 100 mg via SUBCONJUNCTIVAL

## 2018-12-15 MED ORDER — LACTATED RINGERS IV SOLN: INTRAVENOUS | Status: DC | PRN

## 2018-12-15 MED ORDER — ACETAMINOPHEN 160 MG/5ML PO SOLN: 325.0000 mg | Freq: Once | ORAL | Status: DC | PRN

## 2018-12-15 MED ORDER — CYCLOPENTOLATE HCL 1 % OP SOLN
1.0000 [drp] | OPHTHALMIC | Status: AC | PRN
  Administered 2018-12-15 (×3): 1 [drp] via OPHTHALMIC

## 2018-12-15 MED ORDER — HYPROMELLOSE (GONIOSCOPIC) 2.5 % OP SOLN
OPHTHALMIC | Status: DC | PRN
  Administered 2018-12-15: 2 [drp]

## 2018-12-15 MED ORDER — MIDAZOLAM HCL 2 MG/2ML IJ SOLN
INTRAMUSCULAR | Status: AC
  Filled 2018-12-15: qty 2

## 2018-12-15 MED ORDER — ACETAMINOPHEN 325 MG PO TABS: 325.0000 mg | ORAL_TABLET | Freq: Once | ORAL | Status: DC | PRN

## 2018-12-15 MED ORDER — TOBRAMYCIN-DEXAMETHASONE 0.3-0.1 % OP OINT
TOPICAL_OINTMENT | OPHTHALMIC | Status: AC
  Filled 2018-12-15: qty 3.5

## 2018-12-15 MED ORDER — LACTATED RINGERS IV SOLN: INTRAVENOUS | Status: DC

## 2018-12-15 MED ORDER — FENTANYL CITRATE (PF) 100 MCG/2ML IJ SOLN: 25.0000 ug | INTRAMUSCULAR | Status: DC | PRN

## 2018-12-15 MED ORDER — LIDOCAINE HCL 2 % IJ SOLN
INTRAMUSCULAR | Status: DC | PRN
  Administered 2018-12-15: 6 mL via RETROBULBAR

## 2018-12-15 MED ORDER — HYALURONIDASE HUMAN NICU 150 UNIT/ML INJECTION
INTRAMUSCULAR | Status: DC | PRN
  Administered 2018-12-15: 150 [IU]

## 2018-12-15 MED ORDER — TETRACAINE HCL 0.5 % OP SOLN
OPHTHALMIC | Status: AC
  Filled 2018-12-15: qty 4

## 2018-12-15 MED ORDER — BSS IO SOLN
INTRAOCULAR | Status: AC
  Filled 2018-12-15: qty 15

## 2018-12-15 MED ORDER — TROPICAMIDE 1 % OP SOLN
1.0000 [drp] | OPHTHALMIC | Status: AC | PRN
  Administered 2018-12-15 (×3): 1 [drp] via OPHTHALMIC

## 2018-12-15 MED ORDER — TROPICAMIDE 1 % OP SOLN
OPHTHALMIC | Status: AC
  Administered 2018-12-15: 1 [drp] via OPHTHALMIC
  Filled 2018-12-15: qty 15

## 2018-12-15 MED ORDER — CYCLOPENTOLATE HCL 1 % OP SOLN
OPHTHALMIC | Status: AC
  Administered 2018-12-15: 21:00:00 1 [drp] via OPHTHALMIC
  Filled 2018-12-15: qty 2

## 2018-12-15 MED ORDER — PROMETHAZINE HCL 25 MG/ML IJ SOLN: 6.2500 mg | INTRAMUSCULAR | Status: DC | PRN

## 2018-12-15 MED ORDER — OFLOXACIN 0.3 % OP SOLN
1.0000 [drp] | OPHTHALMIC | Status: AC | PRN
  Administered 2018-12-15 (×2): 1 [drp] via OPHTHALMIC

## 2018-12-15 MED ORDER — SODIUM CHLORIDE 0.9 % IV SOLN
INTRAVENOUS | Status: DC
  Administered 2018-12-15: 20:00:00 via INTRAVENOUS

## 2018-12-15 MED ORDER — EPINEPHRINE PF 1 MG/ML IJ SOLN
INTRAMUSCULAR | Status: AC
  Filled 2018-12-15: qty 1

## 2018-12-15 MED ORDER — BSS IO SOLN
INTRAOCULAR | Status: DC | PRN
  Administered 2018-12-15: 500 mL via INTRAOCULAR

## 2018-12-15 SURGICAL SUPPLY — 60 items
APPLICATOR COTTON TIP 6 STRL (MISCELLANEOUS) ×1 IMPLANT
APPLICATOR COTTON TIP 6IN STRL (MISCELLANEOUS) ×3
BLADE MVR KNIFE 20G (BLADE) IMPLANT
BLADE STAB KNIFE 15DEG (BLADE) IMPLANT
BNDG EYE OVAL (GAUZE/BANDAGES/DRESSINGS) ×3 IMPLANT
CANNULA ANT CHAM MAIN (OPHTHALMIC RELATED) IMPLANT
CANNULA DUAL BORE 23G (CANNULA) ×3 IMPLANT
CANNULA DUALBORE 25G (CANNULA) ×3 IMPLANT
CANNULA VLV SOFT TIP 25GA (OPHTHALMIC) ×3 IMPLANT
CAUTERY EYE LOW TEMP 1300F FIN (OPHTHALMIC RELATED) IMPLANT
CLOSURE STERI-STRIP 1/2X4 (GAUZE/BANDAGES/DRESSINGS) ×1
CLSR STERI-STRIP ANTIMIC 1/2X4 (GAUZE/BANDAGES/DRESSINGS) ×2 IMPLANT
COVER MAYO STAND STRL (DRAPES) IMPLANT
DRAPE HALF SHEET 40X57 (DRAPES) ×3 IMPLANT
DRAPE INCISE 51X51 W/FILM STRL (DRAPES) IMPLANT
DRAPE RETRACTOR (MISCELLANEOUS) ×3 IMPLANT
ERASER HMR WETFIELD 23G BP (MISCELLANEOUS) IMPLANT
FORCEPS ECKARDT ILM 25G SERR (OPHTHALMIC RELATED) IMPLANT
FORCEPS GRIESHABER ILM 25G A (INSTRUMENTS) IMPLANT
GAS AUTO FILL CONSTEL (OPHTHALMIC)
GAS AUTO FILL CONSTELLATION (OPHTHALMIC) IMPLANT
GLOVE ECLIPSE 7.5 STRL STRAW (GLOVE) ×3 IMPLANT
GOWN STRL REUS W/ TWL LRG LVL3 (GOWN DISPOSABLE) ×1 IMPLANT
GOWN STRL REUS W/TWL LRG LVL3 (GOWN DISPOSABLE) ×2
KIT BASIN OR (CUSTOM PROCEDURE TRAY) ×3 IMPLANT
KIT PERFLUORON PROCEDURE 5ML (MISCELLANEOUS) ×3 IMPLANT
KIT TURNOVER KIT B (KITS) ×3 IMPLANT
LENS BIOM SUPER VIEW SET DISP (MISCELLANEOUS) ×3 IMPLANT
MICROPICK 25G (MISCELLANEOUS)
NEEDLE 18GX1X1/2 (RX/OR ONLY) (NEEDLE) ×3 IMPLANT
NEEDLE 25GX 5/8IN NON SAFETY (NEEDLE) ×3 IMPLANT
NEEDLE FILTER BLUNT 18X 1/2SAF (NEEDLE) ×2
NEEDLE FILTER BLUNT 18X1 1/2 (NEEDLE) ×1 IMPLANT
NEEDLE HYPO 25GX1X1/2 BEV (NEEDLE) IMPLANT
NEEDLE HYPO 30X.5 LL (NEEDLE) ×6 IMPLANT
NEEDLE RETROBULBAR 25GX1.5 (NEEDLE) ×3 IMPLANT
NS IRRIG 1000ML POUR BTL (IV SOLUTION) ×3 IMPLANT
OIL SILICONE OPHTHALMIC 1000 (Ophthalmic Related) ×3 IMPLANT
OIL SILICONE OPHTHALMIC ADAPTO (Ophthalmic Related) IMPLANT
PACK FRAGMATOME (OPHTHALMIC) IMPLANT
PACK VITRECTOMY CUSTOM (CUSTOM PROCEDURE TRAY) ×3 IMPLANT
PAD ARMBOARD 7.5X6 YLW CONV (MISCELLANEOUS) ×6 IMPLANT
PAK PIK VITRECTOMY CVS 25GA (OPHTHALMIC) ×3 IMPLANT
PENCIL BIPOLAR 25GA STR DISP (OPHTHALMIC RELATED) IMPLANT
PICK MICROPICK 25G (MISCELLANEOUS) IMPLANT
PROBE LASER ILLUM FLEX CVD 25G (OPHTHALMIC) IMPLANT
ROLLS DENTAL (MISCELLANEOUS) IMPLANT
SCRAPER DIAMOND 25GA (OPHTHALMIC RELATED) ×3 IMPLANT
SET INJECTOR OIL FLUID CONSTEL (OPHTHALMIC) ×3 IMPLANT
SHIELD EYE LENSE ONLY DISP (GAUZE/BANDAGES/DRESSINGS) ×3 IMPLANT
SOLUTION ANTI FOG 6CC (MISCELLANEOUS) ×3 IMPLANT
STOPCOCK 4 WAY LG BORE MALE ST (IV SETS) IMPLANT
SUT VICRYL 7 0 TG140 8 (SUTURE) ×3 IMPLANT
SUT VICRYL 8 0 TG140 8 (SUTURE) IMPLANT
SYR 10ML LL (SYRINGE) ×3 IMPLANT
SYR 20ML LL LF (SYRINGE) ×3 IMPLANT
SYR 5ML LL (SYRINGE) ×3 IMPLANT
SYR TB 1ML LUER SLIP (SYRINGE) ×9 IMPLANT
WATER STERILE IRR 1000ML POUR (IV SOLUTION) ×3 IMPLANT
WIPE INSTRUMENT VISIWIPE 73X73 (MISCELLANEOUS) IMPLANT

## 2018-12-15 NOTE — Telephone Encounter (Signed)
Appointment scheduled and message sent through MyChart to patient

## 2018-12-15 NOTE — Anesthesia Preprocedure Evaluation (Addendum)
Anesthesia Evaluation  Patient identified by MRN, date of birth, ID band Patient awake    Reviewed: Allergy & Precautions, NPO status , Patient's Chart, lab work & pertinent test results  Airway Mallampati: II  TM Distance: >3 FB Neck ROM: Full    Dental  (+) Teeth Intact, Dental Advisory Given   Pulmonary neg pulmonary ROS,    breath sounds clear to auscultation       Cardiovascular hypertension, Pt. on medications + dysrhythmias Atrial Fibrillation  Rhythm:Regular Rate:Normal     Neuro/Psych negative neurological ROS     GI/Hepatic negative GI ROS, Neg liver ROS,   Endo/Other  negative endocrine ROS  Renal/GU      Musculoskeletal negative musculoskeletal ROS (+)   Abdominal Normal abdominal exam  (+)   Peds  Hematology negative hematology ROS (+)   Anesthesia Other Findings   Reproductive/Obstetrics                            Lab Results  Component Value Date   WBC 6.2 09/13/2016   HGB 14.9 09/13/2016   HCT 43.1 09/13/2016   MCV 88 09/13/2016   PLT 288 09/13/2016   EKG: sinus bradycardia.  Anesthesia Physical Anesthesia Plan  ASA: III  Anesthesia Plan: MAC   Post-op Pain Management:    Induction: Intravenous  PONV Risk Score and Plan: 2 and Ondansetron, Propofol infusion and Midazolam  Airway Management Planned: Natural Airway and Nasal Cannula  Additional Equipment: None  Intra-op Plan:   Post-operative Plan:   Informed Consent: I have reviewed the patients History and Physical, chart, labs and discussed the procedure including the risks, benefits and alternatives for the proposed anesthesia with the patient or authorized representative who has indicated his/her understanding and acceptance.     Dental advisory given  Plan Discussed with: CRNA  Anesthesia Plan Comments:        Anesthesia Quick Evaluation

## 2018-12-15 NOTE — Discharge Instructions (Signed)
DO NOT SLEEP ON BACK, THE EYE PRESSURE CAN GO UP AND CAUSE VISION LOSS   SLEEP ON SIDE WITH NOSE TO PILLOW  DURING DAY KEEP FACE DOWN OR LEFT SIDE DOWN.  15 MINUTES EVERY 2 HOURS IT IS OK TO LOOK STRAIGHT AHEAD (USE BATHROOM, EAT, WALK, ETC.)

## 2018-12-15 NOTE — Transfer of Care (Signed)
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  Last Pain:  Vitals:   12/15/18 2023  TempSrc:   PainSc: 0-No pain         Complications: No apparent anesthesia complications

## 2018-12-15 NOTE — Brief Op Note (Signed)
12/15/2018  11:19 PM  PATIENT:  Tyler Herrera  63 y.o. 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   PROCEDURE:  Procedure(s): 25 gauge pars plana vitrectomy, endolaser, removal of subretinal bands, retinotomy, drainage of subretinal fluid, perflouron and silicone oil tamonade  (Right)  SURGEON:  Surgeon(s) and Role:    * Jalene Mullet, MD - Primary  PHYSICIAN ASSISTANT:   ASSISTANTS: none   ANESTHESIA:   local and MAC  EBL:  minimal   BLOOD ADMINISTERED:none  DRAINS: none   LOCAL MEDICATIONS USED:  MARCAINE    and LIDOCAINE   SPECIMEN:  No Specimen  DISPOSITION OF SPECIMEN:  N/A  COUNTS:  YES  TOURNIQUET:  * No tourniquets in log *  DICTATION: .Note written in EPIC  PLAN OF CARE: Discharge to home after PACU  PATIENT DISPOSITION:  PACU - hemodynamically stable.   Delay start of Pharmacological VTE agent (>24hrs) due to surgical blood loss or risk of bleeding: not applicable

## 2018-12-15 NOTE — H&P (Signed)
Date of examination:  0731/20  Indication for surgery: Retinal detachment right eye  Pertinent past medical history:  Past Medical History:  Diagnosis Date  . Hyperlipidemia   . Nephrolithiasis   . Persistent atrial fibrillation 05/31/2013    Pertinent ocular history:  myopia  Pertinent family history:  Family History  Problem Relation Age of Onset  . CAD Other        < 59   . Hypertension Other     General:  Healthy appearing patient in no distress.    Eyes:    Acuity OD 20/25  OS 20/25  cc  External: Within normal limits     Anterior segment: Within normal limits       Fundus: inferotemporal retinal detachment right eye  Impression: Retinal detachment right eye  Plan: Retinal detachment repair right eye  Royston Cowper

## 2018-12-15 NOTE — Anesthesia Procedure Notes (Signed)
Procedure Name: MAC Date/Time: 12/15/2018 9:25 PM Performed by: Claris Che, CRNA Pre-anesthesia Checklist: Patient identified, Emergency Drugs available, Suction available, Patient being monitored and Timeout performed Patient Re-evaluated:Patient Re-evaluated prior to induction

## 2018-12-17 NOTE — Anesthesia Postprocedure Evaluation (Signed)
Anesthesia Post Note  Patient: Tyler Herrera  Procedure(s) Performed: PARS PLANA VITRECTOMY WITH 25 GAUGE, ENDOLASER, REMOVAL OF SUBRETINAL  BANDS, DRAINAGE OF SUBRETINAL FLUID,PERFLOURON AND SILICONE OIL TAMPONADE (Right Eye) Repair Of Complex Traction Retinal Detachment, Retinotomy (Right Eye)     Patient location during evaluation: PACU Anesthesia Type: MAC Level of consciousness: awake and alert Pain management: pain level controlled Vital Signs Assessment: post-procedure vital signs reviewed and stable Respiratory status: spontaneous breathing, nonlabored ventilation, respiratory function stable and patient connected to nasal cannula oxygen Cardiovascular status: stable and blood pressure returned to baseline Postop Assessment: no apparent nausea or vomiting Anesthetic complications: no    Last Vitals:  Vitals:   12/15/18 2335 12/15/18 2345  BP: (!) 130/103 (!) 141/86  Pulse: (!) 48 (!) 46  Resp: 13 11  Temp:  36.7 C  SpO2: 97% 96%    Last Pain:  Vitals:   12/15/18 2335  TempSrc:   PainSc: 0-No pain                 Effie Berkshire

## 2018-12-18 ENCOUNTER — Encounter (HOSPITAL_COMMUNITY): Payer: Self-pay | Admitting: Ophthalmology

## 2019-01-16 NOTE — Op Note (Signed)
PATIENT:  Tyler Herrera  63 y.o. 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 Retrobulbar anesthesia was obtained in the right eye without complications. The right eye was prepped and draped in the usual sterile fashion for intraocular surgery.  Using the Alcon 25-gauge trocar system, an infusion line trocar was placed at the 8 o'clock position followed by active trocars, both at 10 and 2 o'clock. Light pipe and vitrector were inserted and a core vitrectomy was performed.  Attention was directed toward limiting any traction on the posterior hyaloid, given the extent of the inferior retinal detachment care was taken to remove the vitreous without mobilization of the inferior retina. Following core vitrectomy, careful indented peripheral vitrectomy was performed. Endocautery was used to make mid-peripheral retinotomies through which the silicone soft-tipped cannula was used to widen the retinotomies and aspirate a portion of the subretinal bands. Serrated intraocular forceps were used to grasp and remove the elevated subretinal bands. Subretinal bands were removed using the serrated forces. Retinal bleeding was controlled with endolaser/endocuatery. Following careful peripheral vitrectomy, attention was directed to the endolaser  portion of the procedure. Endolaser was applied to to the peripheral retina. The soft tipped extrusion canula was used to drain subretinal fluid. Perflouron was placed to flatten the retina and complete drainage of subretinal fluid. A complete air-fluid exchange was performed, and perflouron was removed from the eye.   Endolaser was applied 360 degrees to the periphery with the aid of scleral depression. Following the application of endolaser, Silicone oil was injected into the eye to obtained a near complete oil fill. The trocars were sequentially removed and all noted to be sealed. Subconjunctival injections of Ancef and Decadron were placed. as noted above

## 2019-01-25 ENCOUNTER — Ambulatory Visit (INDEPENDENT_AMBULATORY_CARE_PROVIDER_SITE_OTHER): Payer: BC Managed Care – PPO | Admitting: Cardiovascular Disease

## 2019-01-25 ENCOUNTER — Encounter: Payer: Self-pay | Admitting: Cardiovascular Disease

## 2019-01-25 ENCOUNTER — Other Ambulatory Visit: Payer: Self-pay

## 2019-01-25 VITALS — BP 144/82 | HR 56 | Ht 74.0 in | Wt 194.0 lb

## 2019-01-25 DIAGNOSIS — I10 Essential (primary) hypertension: Secondary | ICD-10-CM | POA: Diagnosis not present

## 2019-01-25 DIAGNOSIS — I4819 Other persistent atrial fibrillation: Secondary | ICD-10-CM

## 2019-01-25 DIAGNOSIS — I5022 Chronic systolic (congestive) heart failure: Secondary | ICD-10-CM

## 2019-01-25 NOTE — Progress Notes (Signed)
Cardiology Office Note:    Date:  01/25/2019   ID:  Tyler Herrera, DOB 18-Jan-1956, MRN [id number]  PCP:  Jarome Matin, MD  Cardiologist:  Rubyann Lingle  Electrophysiologist:  Johney Frame   Referring MD: Jarome Matin, MD   Chief Complaint  Patient presents with  . Atrial Fibrillation  . Hyperlipidemia     Sept. 10, 2020    Tyler Herrera is a 64 y.o. male with a hx of atrial fib and HTN I saw him many years ago  Father and his uncles have CAD  Has had hyperlipidemia in the past.   Did not like the side effect of the meds.  We were asked to see him by Dr. Eloise Harman for further evaluation of his hyperlipidemia.  No CP ,  No dyspnea  Has had some PAF this summer.  Episodes last 2-3 days .  No excess alcohol.   No OSA as far as he knows.    Walks quite a bit.  4-5 miles a day  Works as a Surveyor, minerals for [employer].     Past Medical History:  Diagnosis Date  . History of kidney stones   . Hyperlipidemia   . Nephrolithiasis    kidney stones  . Persistent atrial fibrillation 05/31/2013    Past Surgical History:  Procedure Laterality Date  . CARDIOVERSION N/A 08/12/2016   Procedure: CARDIOVERSION;  Surgeon: Wendall Stade, MD;  Location[facility] END[facility];  Service: Cardiovascular;  Laterality: N/A;  . none    . PARS PLANA VITRECTOMY Right 12/15/2018   Procedure: PARS PLANA VITRECTOMY WITH 25 GAUGE, ENDOLASER, REMOVAL OF SUBRETINAL  BANDS, DRAINAGE OF SUBRETINAL FLUID,PERFLOURON AND SILICONE OIL TAMPONADE;  Surgeon: Carmela Rima, MD;  Location: [facility] OR;  Service: Ophthalmology;  Laterality: Right;  . REPAIR OF COMPLEX TRACTION RETINAL DETACHMENT Right 12/15/2018   Procedure: Repair Of Complex Traction Retinal Detachment, Retinotomy;  Surgeon: Carmela Rima, MD;  Location: [facility] OR;  Service: Ophthalmology;  Laterality: Right;    Current Medications: Current Meds  Medication Sig  . losartan (COZAAR) 25 MG tablet Take 1 tablet by mouth daily.  . sotalol (BETAPACE) 120 MG  tablet Take 1 tablet by mouth every 12 (twelve) hours.     Allergies:   Patient has no known allergies.   Social History   Socioeconomic History  . Marital status: Unknown    Spouse name: Not on file  . Number of children: Not on file  . Years of education: Not on file  . Highest education level: Not on file  Occupational History  . Not on file  Social Needs  . Financial resource strain: Not on file  . Food insecurity    Worry: Not on file    Inability: Not on file  . Transportation needs    Medical: Not on file    Non-medical: Not on file  Tobacco Use  . Smoking status: Never Smoker  . Smokeless tobacco: Never Used  Substance and Sexual Activity  . Alcohol use: Yes    Alcohol/week: 7.0 standard drinks    Types: 7 Cans of beer per week    Comment: 1 beer a day, 3-5 beers on the weekends  . Drug use: No  . Sexual activity: Not on file  Lifestyle  . Physical activity    Days per week: Not on file    Minutes per session: Not on file  . Stress: Not on file  Relationships  . Social connections    Talks on phone: Not on file  Gets together: Not on file    Attends religious service: Not on file    Active member of club or organization: Not on file    Attends meetings of clubs or organizations: Not on file    Relationship status: Not on file  Other Topics Concern  . Not on file  Social History Narrative   [place].   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  ROS:   Please see the history of present illness.     All other systems reviewed and are negative.  EKGs/Labs/Other Studies Reviewed:    The following studies were reviewed today:   EKG:   Sept. 10, 2020:  Sinus brady at 56.  No ST or T wave changes.   Recent Labs: 04/03/2018: Magnesium 2.3 12/15/2018: BUN 17; Creatinine, Ser  0.95; Hemoglobin 15.3; Platelets 361; Potassium 3.8; Sodium 136  Recent Lipid Panel No results found for: CHOL, TRIG, HDL, CHOLHDL, VLDL, LDLCALC, LDLDIRECT  Physical Exam:    VS:  BP (!) 144/82   Pulse (!) 56   Ht 6\' 2"  (1.88 m)   Wt 194 lb (88 kg)   SpO2 96%   BMI 24.91 kg/m     Wt Readings from Last 3 Encounters:  01/25/19 194 lb (88 kg)  12/15/18 195 lb (88.5 kg)  04/03/18 195 lb 12.8 oz (88.8 kg)     GEN:  Well nourished, well developed in no acute distress HEENT: Normal NECK: No JVD; No carotid bruits LYMPHATICS: No lymphadenopathy CARDIAC: RRR, no murmurs, rubs, gallops RESPIRATORY:  Clear to auscultation without rales, wheezing or rhonchi  ABDOMEN: Soft, non-tender, non-distended MUSCULOSKELETAL:  No edema; No deformity  SKIN: Warm and dry NEUROLOGIC:  Alert and oriented x 3 PSYCHIATRIC:  Normal affect   ASSESSMENT:    1. Persistent atrial fibrillation   2. Chronic systolic heart failure (HCC)   3. Essential hypertension    PLAN:    In order of problems listed above:  1. Hyperlipidemia :   Has tried Lipator .  Does not remember if tried any thing else.  Check labs today .   If lipids are elevated,  Will try crestor  If he fails that, will go with a PCSK-9 inhibitor  2.   PAF :   Is on sotlolol.  CHADS2VASC = 1 ( HTN)   Has been on Eliquis at one point.    He and Dr. Johney FrameAllred have discussed restarting when he reaches age 63.    He is having several days of PAF . He does not want to start any meds yet.   Will leave it up to Dr. Johney FrameAllred.    Return in 1 year    Medication Adjustments/Labs and Tests Ordered: Current medicines are reviewed at length with the patient today.  Concerns regarding medicines are outlined above.  Orders Placed This Encounter  Procedures  . Lipid panel  . Hepatic function panel  . Basic metabolic panel  . EKG 12-Lead   No orders of the defined types were placed in this encounter.   Patient Instructions  Medication  Instructions:  Your physician recommends that you continue on your current medications as directed. Please refer to the Current Medication list given to you today.  If you need a refill on your cardiac medications before your next appointment, please call  your pharmacy.   Lab work: Your physician recommends that you return for lab work today: Lipids, Liver Enzymes, BMET   Testing/Procedures: None Ordered  Follow-Up: At [facility], you and your health needs are our priority.  As part of our continuing mission to provide you with exceptional heart care, we have created designated Provider Care Teams.  These Care Teams include your primary Cardiologist (physician) and Advanced Practice Providers (APPs -  Physician Assistants and Nurse Practitioners) who all work together to provide you with the care you need, when you need it. You will need a follow up appointment in:  1 years.  Please call our office 2 months in advance to schedule this appointment.  You may see Dr. Acie Fredrickson or one of the following Advanced Practice Providers on your designated Care Team: Richardson Dopp, PA-C [place], [place] . Daune Perch, NP  Any Other Special Instructions Will Be Listed Below (If Applicable).       Signed, Mertie Moores, MD  01/25/2019 5:33 PM    [place]

## 2019-01-25 NOTE — Patient Instructions (Addendum)
Medication Instructions:  Your physician recommends that you continue on your current medications as directed. Please refer to the Current Medication list given to you today.  If you need a refill on your cardiac medications before your next appointment, please call your pharmacy.   Lab work: Your physician recommends that you return for lab work today: Lipids, Liver Enzymes, BMET   Testing/Procedures: None Ordered  Follow-Up: At [facility], you and your health needs are our priority.  As part of our continuing mission to provide you with exceptional heart care, we have created designated Provider Care Teams.  These Care Teams include your primary Cardiologist (physician) and Advanced Practice Providers (APPs -  Physician Assistants and Nurse Practitioners) who all work together to provide you with the care you need, when you need it. You will need a follow up appointment in:  1 years.  Please call our office 2 months in advance to schedule this appointment.  You may see Dr. Acie Fredrickson or one of the following Advanced Practice Providers on your designated Care Team: Richardson Dopp, PA-C [place], [place] . Daune Perch, NP  Any Other Special Instructions Will Be Listed Below (If Applicable).

## 2019-01-26 LAB — BASIC METABOLIC PANEL
BUN/Creatinine Ratio: 18 (ref 10–24)
BUN: 20 mg/dL (ref 8–27)
CO2: 25 mmol/L (ref 20–29)
Calcium: 9.8 mg/dL (ref 8.6–10.2)
Chloride: 102 mmol/L (ref 96–106)
Creatinine, Ser: 1.12 mg/dL (ref 0.76–1.27)
GFR calc Af Amer: 80 mL/min/{1.73_m2} (ref >59)
GFR calc non Af Amer: 70 mL/min/{1.73_m2} (ref >59)
Glucose: 91 mg/dL (ref 65–99)
Potassium: 4.6 mmol/L (ref 3.5–5.2)
Sodium: 141 mmol/L (ref 134–144)

## 2019-01-26 LAB — HEPATIC FUNCTION PANEL
ALT: 35 IU/L (ref 0–44)
AST: 26 IU/L (ref 0–40)
Albumin: 5.1 g/dL — ABNORMAL HIGH (ref 3.8–4.8)
Alkaline Phosphatase: 65 IU/L (ref 39–117)
Bilirubin Total: 0.5 mg/dL (ref 0.0–1.2)
Bilirubin, Direct: 0.13 mg/dL (ref 0.00–0.40)
Total Protein: 7 g/dL (ref 6.0–8.5)

## 2019-01-26 LAB — LIPID PANEL
Chol/HDL Ratio: 6.8 ratio — ABNORMAL HIGH (ref 0.0–5.0)
Cholesterol, Total: 211 mg/dL — ABNORMAL HIGH (ref 100–199)
HDL: 31 mg/dL — ABNORMAL LOW (ref >39)
LDL Chol Calc (NIH): 139 mg/dL — ABNORMAL HIGH (ref 0–99)
Triglycerides: 225 mg/dL — ABNORMAL HIGH (ref 0–149)
VLDL Cholesterol Cal: 41 mg/dL — ABNORMAL HIGH (ref 5–40)

## 2019-02-15 ENCOUNTER — Ambulatory Visit: Payer: Managed Care, Other (non HMO) | Admitting: Cardiovascular Disease

## 2019-04-01 ENCOUNTER — Other Ambulatory Visit: Payer: Self-pay | Admitting: Internal Medicine

## 2019-06-11 ENCOUNTER — Inpatient Hospital Stay (HOSPITAL_COMMUNITY)
Admission: EM | Admit: 2019-06-11 | Discharge: 2019-06-14 | DRG: 062 | Disposition: A | Discharge status: Home / Self Care | Payer: BC Managed Care – PPO | Attending: Neurology | Admitting: Neurology

## 2019-06-11 ENCOUNTER — Encounter (HOSPITAL_COMMUNITY): Payer: Self-pay | Admitting: Neurology

## 2019-06-11 ENCOUNTER — Other Ambulatory Visit: Payer: Self-pay

## 2019-06-11 ENCOUNTER — Emergency Department (HOSPITAL_COMMUNITY): Payer: BC Managed Care – PPO

## 2019-06-11 DIAGNOSIS — E7800 Pure hypercholesterolemia, unspecified: Secondary | ICD-10-CM | POA: Diagnosis not present

## 2019-06-11 DIAGNOSIS — I639 Cerebral infarction, unspecified: Secondary | ICD-10-CM

## 2019-06-11 DIAGNOSIS — I63431 Cerebral infarction due to embolism of right posterior cerebral artery: Principal | ICD-10-CM | POA: Diagnosis present

## 2019-06-11 DIAGNOSIS — I6389 Other cerebral infarction: Secondary | ICD-10-CM | POA: Diagnosis not present

## 2019-06-11 DIAGNOSIS — N179 Acute kidney failure, unspecified: Secondary | ICD-10-CM | POA: Diagnosis present

## 2019-06-11 DIAGNOSIS — I4819 Other persistent atrial fibrillation: Secondary | ICD-10-CM | POA: Diagnosis present

## 2019-06-11 DIAGNOSIS — R471 Dysarthria and anarthria: Secondary | ICD-10-CM | POA: Diagnosis present

## 2019-06-11 DIAGNOSIS — G8191 Hemiplegia, unspecified affecting right dominant side: Secondary | ICD-10-CM | POA: Diagnosis present

## 2019-06-11 DIAGNOSIS — I10 Essential (primary) hypertension: Secondary | ICD-10-CM | POA: Diagnosis not present

## 2019-06-11 DIAGNOSIS — R4781 Slurred speech: Secondary | ICD-10-CM | POA: Diagnosis present

## 2019-06-11 DIAGNOSIS — Z9282 Status post administration of tPA (rtPA) in a different facility within the last 24 hours prior to admission to current facility: Secondary | ICD-10-CM

## 2019-06-11 DIAGNOSIS — R29810 Facial weakness: Secondary | ICD-10-CM | POA: Diagnosis present

## 2019-06-11 DIAGNOSIS — E785 Hyperlipidemia, unspecified: Secondary | ICD-10-CM | POA: Diagnosis not present

## 2019-06-11 DIAGNOSIS — H53462 Homonymous bilateral field defects, left side: Secondary | ICD-10-CM | POA: Diagnosis present

## 2019-06-11 DIAGNOSIS — Z8249 Family history of ischemic heart disease and other diseases of the circulatory system: Secondary | ICD-10-CM

## 2019-06-11 DIAGNOSIS — Z20822 Contact with and (suspected) exposure to covid-19: Secondary | ICD-10-CM | POA: Diagnosis present

## 2019-06-11 DIAGNOSIS — Z7982 Long term (current) use of aspirin: Secondary | ICD-10-CM

## 2019-06-11 DIAGNOSIS — R519 Headache, unspecified: Secondary | ICD-10-CM | POA: Diagnosis present

## 2019-06-11 DIAGNOSIS — Z87442 Personal history of urinary calculi: Secondary | ICD-10-CM

## 2019-06-11 DIAGNOSIS — R29703 NIHSS score 3: Secondary | ICD-10-CM | POA: Diagnosis present

## 2019-06-11 DIAGNOSIS — R270 Ataxia, unspecified: Secondary | ICD-10-CM | POA: Diagnosis present

## 2019-06-11 DIAGNOSIS — I480 Paroxysmal atrial fibrillation: Secondary | ICD-10-CM | POA: Diagnosis not present

## 2019-06-11 DIAGNOSIS — I4891 Unspecified atrial fibrillation: Secondary | ICD-10-CM | POA: Diagnosis present

## 2019-06-11 LAB — DIFFERENTIAL
Abs Immature Granulocytes: 0.03 10*3/uL (ref 0.00–0.07)
Basophils Absolute: 0.1 10*3/uL (ref 0.0–0.1)
Basophils Relative: 1 %
Eosinophils Absolute: 0.2 10*3/uL (ref 0.0–0.5)
Eosinophils Relative: 2 %
Immature Granulocytes: 0 %
Lymphocytes Relative: 27 %
Lymphs Abs: 2 10*3/uL (ref 0.7–4.0)
Monocytes Absolute: 0.8 10*3/uL (ref 0.1–1.0)
Monocytes Relative: 10 %
Neutro Abs: 4.4 10*3/uL (ref 1.7–7.7)
Neutrophils Relative %: 60 %

## 2019-06-11 LAB — ETHANOL: Alcohol, Ethyl (B): <10 mg/dL (ref <10)

## 2019-06-11 LAB — COMPREHENSIVE METABOLIC PANEL
ALT: 27 U/L (ref 0–44)
AST: 23 U/L (ref 15–41)
Albumin: 4.2 g/dL (ref 3.5–5.0)
Alkaline Phosphatase: 51 U/L (ref 38–126)
Anion gap: 9 (ref 5–15)
BUN: 23 mg/dL (ref 8–23)
CO2: 23 mmol/L (ref 22–32)
Calcium: 9.3 mg/dL (ref 8.9–10.3)
Chloride: 110 mmol/L (ref 98–111)
Creatinine, Ser: 1.28 mg/dL — ABNORMAL HIGH (ref 0.61–1.24)
GFR calc Af Amer: >60 mL/min (ref >60)
GFR calc non Af Amer: 59 mL/min — ABNORMAL LOW (ref >60)
Glucose, Bld: 123 mg/dL — ABNORMAL HIGH (ref 70–99)
Potassium: 4.1 mmol/L (ref 3.5–5.1)
Sodium: 142 mmol/L (ref 135–145)
Total Bilirubin: 0.4 mg/dL (ref 0.3–1.2)
Total Protein: 6.4 g/dL — ABNORMAL LOW (ref 6.5–8.1)

## 2019-06-11 LAB — I-STAT CHEM 8, ED
BUN: 24 mg/dL — ABNORMAL HIGH (ref 8–23)
Calcium, Ion: 1.09 mmol/L — ABNORMAL LOW (ref 1.15–1.40)
Chloride: 107 mmol/L (ref 98–111)
Creatinine, Ser: 1.3 mg/dL — ABNORMAL HIGH (ref 0.61–1.24)
Glucose, Bld: 117 mg/dL — ABNORMAL HIGH (ref 70–99)
HCT: 44 % (ref 39.0–52.0)
Hemoglobin: 15 g/dL (ref 13.0–17.0)
Potassium: 4 mmol/L (ref 3.5–5.1)
Sodium: 141 mmol/L (ref 135–145)
TCO2: 24 mmol/L (ref 22–32)

## 2019-06-11 LAB — RESPIRATORY PANEL BY RT PCR (FLU A&B, COVID)
Influenza A by PCR: NEGATIVE
Influenza B by PCR: NEGATIVE
SARS Coronavirus 2 by RT PCR: NEGATIVE

## 2019-06-11 LAB — CBG MONITORING, ED: Glucose-Capillary: 110 mg/dL — ABNORMAL HIGH (ref 70–99)

## 2019-06-11 LAB — APTT: aPTT: 28 seconds (ref 24–36)

## 2019-06-11 LAB — PROTIME-INR
INR: 1 (ref 0.8–1.2)
Prothrombin Time: 13.4 seconds (ref 11.4–15.2)

## 2019-06-11 LAB — CBC
HCT: 45.8 % (ref 39.0–52.0)
Hemoglobin: 15.6 g/dL (ref 13.0–17.0)
MCH: 30.4 pg (ref 26.0–34.0)
MCHC: 34.1 g/dL (ref 30.0–36.0)
MCV: 89.3 fL (ref 80.0–100.0)
Platelets: 274 10*3/uL (ref 150–400)
RBC: 5.13 MIL/uL (ref 4.22–5.81)
RDW: 12.1 % (ref 11.5–15.5)
WBC: 7.4 10*3/uL (ref 4.0–10.5)
nRBC: 0 % (ref 0.0–0.2)

## 2019-06-11 LAB — HIV ANTIBODY (ROUTINE TESTING W REFLEX): HIV Screen 4th Generation wRfx: NONREACTIVE

## 2019-06-11 MED ORDER — IOHEXOL 350 MG/ML SOLN
75.0000 mL | Freq: Once | INTRAVENOUS | Status: AC | PRN
  Administered 2019-06-11: 75 mL via INTRAVENOUS

## 2019-06-11 MED ORDER — PANTOPRAZOLE SODIUM 40 MG IV SOLR
40.0000 mg | Freq: Every day | INTRAVENOUS | Status: DC
  Administered 2019-06-11: 23:00:00 40 mg via INTRAVENOUS
  Filled 2019-06-11: qty 40

## 2019-06-11 MED ORDER — SODIUM CHLORIDE 0.9 % IV SOLN: INTRAVENOUS | Status: DC

## 2019-06-11 MED ORDER — ACETAMINOPHEN 650 MG RE SUPP: 650.0000 mg | RECTAL | Status: DC | PRN

## 2019-06-11 MED ORDER — ACETAMINOPHEN 160 MG/5ML PO SOLN: 650.0000 mg | ORAL | Status: DC | PRN

## 2019-06-11 MED ORDER — CHLORHEXIDINE GLUCONATE CLOTH 2 % EX PADS
6.0000 | MEDICATED_PAD | Freq: Every day | CUTANEOUS | Status: DC
  Administered 2019-06-11 – 2019-06-13 (×2): 6 via TOPICAL

## 2019-06-11 MED ORDER — STROKE: EARLY STAGES OF RECOVERY BOOK
Freq: Once | Status: AC
  Filled 2019-06-11: qty 1

## 2019-06-11 MED ORDER — ACETAMINOPHEN 325 MG PO TABS
650.0000 mg | ORAL_TABLET | ORAL | Status: DC | PRN
  Administered 2019-06-11 – 2019-06-12 (×4): 650 mg via ORAL
  Administered 2019-06-13: 01:00:00 325 mg via ORAL
  Administered 2019-06-13: 650 mg via ORAL
  Administered 2019-06-13 (×2): 325 mg via ORAL
  Administered 2019-06-14 (×2): 650 mg via ORAL
  Filled 2019-06-11 (×10): qty 2

## 2019-06-11 MED ORDER — SODIUM CHLORIDE 0.9 % IV SOLN
50.0000 mL | Freq: Once | INTRAVENOUS | Status: AC
  Administered 2019-06-11: 22:00:00 50 mL via INTRAVENOUS

## 2019-06-11 MED ORDER — ALTEPLASE (STROKE) FULL DOSE INFUSION
0.9000 mg/kg | Freq: Once | INTRAVENOUS | Status: AC
  Administered 2019-06-11: 21:00:00 78.6 mg via INTRAVENOUS
  Filled 2019-06-11: qty 100

## 2019-06-11 NOTE — Progress Notes (Signed)
PHARMACIST CODE STROKE RESPONSE  Notified to mix tPA at 2039 by Dr. Laurence Slate Delivered tPA to RN at 2041  tPA dose = 7.9mg  bolus over 1 minute followed by 70.7mg  for a total dose of 78.6mg  over 1 hour  Issues/delays encountered (if applicable): N/a  Joaquim Lai PharmD. BCPS 06/11/19 8:48 PM

## 2019-06-11 NOTE — ED Triage Notes (Signed)
Pt was reportedly at home in the shower at approx. 1930 when wife heard him "call out" to her. Stated he did not fall, but wife noted R sided weakness and R facial droop. Wife sat him down and called EMS. Upon EMS arrival pt had N/V and periods of confusion with slurred speech. Also noted was an abnormal gait on the R side. Code stroke called in the field.

## 2019-06-11 NOTE — Code Documentation (Signed)
Responded to Code Stroke called at 2013 for weakness, slurred speech, and facial droop, LSN-1945. Pt arrived at 2022, CBG-110, NIH originally 2 for ataxia however worsened to 3(ataxia and slurred speech) while in CT. CT head negative for acute interventions. TPA started at 2044. CTA pending. Plan to admit to Neuro ICU.

## 2019-06-11 NOTE — ED Provider Notes (Signed)
[facility] EMERGENCY DEPARTMENT Provider Note   CSN: [id number] Arrival date & time: 06/11/19  2022  An emergency department physician performed an initial assessment on this suspected stroke patient at 2024.  History Chief Complaint  Patient presents with  . Code Stroke    Tyler Herrera is a 64 y.o. male.  Patient is a 64 year old male who presents as a code stroke.  He has a history of hyperlipidemia and A. Fib.  It does not appear that he is on anticoagulants.  He had presented with some right-sided weakness associated with dizziness and ataxia as well as slurred speech.  This happened just prior to arrival the emergency department.  His symptoms persisted, EMS was called who activated code stroke.        Past Medical History:  Diagnosis Date  . History of kidney stones   . Hyperlipidemia   . Nephrolithiasis    kidney stones  . Persistent atrial fibrillation 05/31/2013    Patient Active Problem List   Diagnosis Date Noted  . Ischemic stroke (HCC) 06/11/2019  . Atrial fibrillation (HCC) 06/06/2013    Past Surgical History:  Procedure Laterality Date  . CARDIOVERSION N/A 08/12/2016   Procedure: CARDIOVERSION;  Surgeon: Wendall Stade, MD;  Location: [facility] ENDOSCOPY;  Service: Cardiovascular;  Laterality: N/A;  . none    . PARS PLANA VITRECTOMY Right 12/15/2018   Procedure: PARS PLANA VITRECTOMY WITH 25 GAUGE, ENDOLASER, REMOVAL OF SUBRETINAL  BANDS, DRAINAGE OF SUBRETINAL FLUID,PERFLOURON AND SILICONE OIL TAMPONADE;  Surgeon: Carmela Rima, MD;  Location: [facility] OR;  Service: Ophthalmology;  Laterality: Right;  . 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 CAD Other        < 55   . Hypertension Other      Social History   Tobacco Use  . Smoking status: Never Smoker  . Smokeless tobacco: Never Used  Substance Use Topics  . Alcohol use: Yes    Alcohol/week: 7.0 standard drinks    Types: 7 Cans of beer per week    Comment: 1 beer a day, 3-5 beers on the weekends  . Drug use: No    Home Medications Prior to Admission medications   Medication Sig Start Date End Date Taking? Authorizing Provider  ketorolac (ACULAR) 0.5 % ophthalmic solution Place 1 drop into the right eye 4 (four) times daily.   Yes [provider]  losartan (COZAAR) 25 MG tablet Take 1 tablet (25 mg total) by mouth daily. 04/03/19  Yes Allred, Fayrene Fearing, MD  prednisoLONE acetate (PRED FORTE) 1 % ophthalmic suspension Place 1 drop into the right eye 4 (four) times daily.   Yes [provider]  sotalol (BETAPACE) 120 MG tablet Take 1 tablet (120 mg total) by mouth every 12 (twelve) hours. 04/03/19  Yes AllredFayrene Fearing, MD    Allergies    Patient has no known allergies.  Review of Systems   Review of Systems  Constitutional: Negative for chills, diaphoresis, fatigue and fever.  HENT: Negative for congestion, rhinorrhea and sneezing.   Eyes: Negative.   Respiratory: Negative for cough, chest tightness and shortness of breath.   Cardiovascular: Negative for chest pain and leg swelling.  Gastrointestinal: Negative for abdominal pain, blood in stool, diarrhea, nausea and  vomiting.  Genitourinary: Negative for difficulty urinating, flank pain, frequency and hematuria.  Musculoskeletal: Negative for arthralgias and back pain.  Skin: Negative for rash.  Neurological: Positive for dizziness, speech difficulty, weakness and headaches. Negative for numbness.    Physical Exam Updated Vital Signs BP 131/81   Pulse (!) 58   Temp 98.1 F (36.7 C)   Resp (!) 21   Wt 87.3 kg   SpO2 95%   BMI 24.71 kg/m   Physical Exam Constitutional:      Appearance: He is well-developed.  HENT:     Head: Normocephalic  and atraumatic.  Eyes:     Pupils: Pupils are equal, round, and reactive to light.     Comments: Nystagmus present  Cardiovascular:     Rate and Rhythm: Normal rate and regular rhythm.     Heart sounds: Normal heart sounds.  Pulmonary:     Effort: Pulmonary effort is normal. No respiratory distress.     Breath sounds: Normal breath sounds. No wheezing or rales.  Chest:     Chest wall: No tenderness.  Abdominal:     General: Bowel sounds are normal.     Palpations: Abdomen is soft.     Tenderness: There is no abdominal tenderness. There is no guarding or rebound.  Musculoskeletal:        General: Normal range of motion.     Cervical back: Normal range of motion and neck supple.  Lymphadenopathy:     Cervical: No cervical adenopathy.  Skin:    General: Skin is warm and dry.     Findings: No rash.  Neurological:     Mental Status: He is alert and oriented to person, place, and time.     Comments: Slurred speech, some weakness noted to RUE/RLE     ED Results / Procedures / Treatments   Labs (all labs ordered are listed, but only abnormal results are displayed) Labs Reviewed  COMPREHENSIVE METABOLIC PANEL - Abnormal; Notable for the following components:      Result Value   Glucose, Bld 123 (*)    Creatinine, Ser 1.28 (*)    Total Protein 6.4 (*)    GFR calc non Af Amer 59 (*)    All other components within normal limits  I-STAT CHEM 8, ED - Abnormal; Notable for the following components:   BUN 24 (*)    Creatinine, Ser 1.30 (*)    Glucose, Bld 117 (*)    Calcium, Ion 1.09 (*)    All other components within normal limits  CBG MONITORING, ED - Abnormal; Notable for the following components:   Glucose-Capillary 110 (*)    All other components within normal limits  RESPIRATORY PANEL BY RT PCR (FLU A&B, COVID)  PROTIME-INR  APTT  CBC  DIFFERENTIAL  ETHANOL  RAPID URINE DRUG SCREEN, HOSP PERFORMED  URINALYSIS, ROUTINE W REFLEX MICROSCOPIC  HIV ANTIBODY (ROUTINE TESTING  W REFLEX)  HEMOGLOBIN A1C  LIPID PANEL    EKG None  Radiology CT HEAD CODE STROKE WO CONTRAST  Result Date: 06/11/2019 CLINICAL DATA:  Code stroke. Weakness, slurred speech, and facial droop. EXAM: CT HEAD WITHOUT CONTRAST TECHNIQUE: Contiguous axial images were obtained from the base of the skull through the vertex without intravenous contrast. COMPARISON:  None. FINDINGS: Brain: There is no evidence of acute infarct, intracranial hemorrhage, mass, midline shift, or extra-axial fluid collection. The ventricles and sulci are normal. Vascular: Mild carotid siphon atherosclerosis. No hyperdense vessel. Skull: No fracture suspicious osseous lesion. Sinuses/Orbits:  Prior treatment of right retinal detachment. Minimal right maxillary sinus mucosal thickening. Clear mastoid air cells. Other: None. ASPECTS [facility] Stroke Program Early CT Score) - Ganglionic level infarction (caudate, lentiform nuclei, internal capsule, insula, M1-M3 cortex): 7 - Supraganglionic infarction (M4-M6 cortex): 3 Total score (0-10 with 10 being normal): 10 IMPRESSION: 1. No evidence of acute intracranial abnormality. 2. ASPECTS is 10. These results were communicated to Dr. Lorraine Lax at 8:42 pm on 06/11/2019 by text page via the [facility] messaging system. Electronically Signed   By: Logan Bores M.D.   On: 06/11/2019 20:42    Procedures Procedures (including critical care time)  Medications Ordered in ED Medications  alteplase (ACTIVASE) 1 mg/mL infusion 78.6 mg (has no administration in time range)    Followed by  0.9 %  sodium chloride infusion (has no administration in time range)   stroke: mapping our early stages of recovery book (has no administration in time range)  0.9 %  sodium chloride infusion (has no administration in time range)  acetaminophen (TYLENOL) tablet 650 mg (has no administration in time range)    Or  acetaminophen (TYLENOL) 160 MG/5ML solution 650 mg (has no administration in time range)    Or   acetaminophen (TYLENOL) suppository 650 mg (has no administration in time range)  pantoprazole (PROTONIX) injection 40 mg (has no administration in time range)  iohexol (OMNIPAQUE) 350 MG/ML injection 75 mL (75 mLs Intravenous Contrast Given 06/11/19 2051)    ED Course  I have reviewed the triage vital signs and the nursing notes.  Pertinent labs & imaging results that were available during my care of the patient were reviewed by me and considered in my medical decision making (see chart for details).  Clinical Course as of Jun 10 2113  Mon Jun 11, 2019  2039 CT HEAD CODE STROKE WO CONTRAST [KH]    Clinical Course User Index [KH] Kizzie Fantasia, MD   MDM Rules/Calculators/A&P                      Patient is here and code stroke activation.  He was given TPA under the care of Dr. Malen Gauze.  His CT scan shows no evidence of intracranial hemorrhage.  His labs are nonconcerning.  There is a slight elevation in his creatinine.  He will be admitted to the ICU.  He has had some improvement in his slurred speech since the TPA. Final Clinical Impression(s) / ED Diagnoses Final diagnoses:  Acute ischemic stroke [facility])    Rx / DC Orders ED Discharge Orders    None       Malvin Johns, MD 06/11/19 2123

## 2019-06-11 NOTE — ED Notes (Signed)
Condom cath placed on patient

## 2019-06-11 NOTE — ED Notes (Signed)
Pt continued to vomit. Neurologist ordered second dose of zofran given by this nurse.

## 2019-06-11 NOTE — ED Notes (Signed)
Pt actively vomiting. Neurologist ordered 4 mg zofran. Given by this nurse.

## 2019-06-11 NOTE — H&P (Signed)
Chief Complaint: Slurred speech, dizziness and right side weakness  History obtained from: Patient and Chart   HPI:                                                                                                                                       Tyler Herrera is a 64 y.o. male with past medical history significant for atrial fibrillation aspirin 81 mg, hyperlipidemia, history of kidney stones presents as a code stroke with sudden onset slurred speech, dizziness and right-sided weakness that started 7:45 PM.  Onset and last known well at 7:45 PM he was in the shower.  States that he heard a weird noise and then noticed his speech was slurred and felt right side was weak. Per EMS assessment he had right facial droop, no signficant weakness in right side. Was vomiting en route.  On arrival to [facility] ER, patient alert and oriented x4. BP 140 systolic. NIHSS was 3 for ataxia in both RUE and RLE and slurred speech.  Denies headache, numbess, double vision. CT head showed no acute findings.  After discussing risk versus benefit, due to disabling deficits from significant ataxia on dominant side we administered IV tPA>   Date last known well: 06/11/19 Time last known well: 7.45 pm tPA Given: yes NIHSS: 3 Baseline MRS 0    Past Medical History:  Diagnosis Date  . History of kidney stones   . Hyperlipidemia   . Nephrolithiasis    kidney stones  . Persistent atrial fibrillation 05/31/2013    Past Surgical History:  Procedure Laterality Date  . CARDIOVERSION N/A 08/12/2016   Procedure: CARDIOVERSION;  Surgeon: Wendall Stade, MD;  Location: [facility] ENDOSCOPY;  Service: Cardiovascular;  Laterality: N/A;  . none    . PARS PLANA VITRECTOMY Right 12/15/2018   Procedure: PARS PLANA VITRECTOMY WITH 25 GAUGE, ENDOLASER, REMOVAL OF SUBRETINAL  BANDS, DRAINAGE OF SUBRETINAL FLUID,PERFLOURON AND SILICONE OIL TAMPONADE;  Surgeon: Carmela Rima, MD;  Location: [facility] OR;  Service: Ophthalmology;   Laterality: Right;  . 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 CAD Other        < 55   . Hypertension Other    Social History:  reports that he has never smoked. He has never used smokeless tobacco. He reports current alcohol use of about 7.0 standard drinks of alcohol per week. He reports that he does not use drugs.  Allergies: No Known Allergies  Medications:  I reviewed home medications   ROS:                                                                                                                                     14 systems reviewed and negative except above    Examination:                                                                                                     General: Appears well-developed  Psych: Affect appropriate to situation Eyes: No scleral injection HENT: No OP obstrucion Head: Normocephalic.  Cardiovascular: Normal rate and regular rhythm.  Respiratory: Effort normal and breath sounds normal to anterior ascultation GI: Soft.  No distension. There is no tenderness.  Skin: WDI    Neurological Examination Mental Status: Alert, oriented, thought content appropriate.  Speech fluent without evidence of aphasia. Dysarthria. Able to follow 3 step commands without difficulty. Cranial Nerves: II: Visual fields grossly normal,  III,IV, VI: ptosis not present, extra-ocular motions intact bilaterally, pupils equal, round, reactive to light and accommodation V,VII: smile symmetric, facial light touch sensation normal bilaterally VIII: hearing normal bilaterally IX,X: uvula rises symmetrically XI:  bilateral shoulder shrug XII: midline tongue extension Motor: Right : Upper extremity   5/5    Left:     Upper extremity   5/5  Lower extremity   5/5     Lower extremity   5/5 Tone and bulk:normal tone throughout; no atrophy noted Sensory: Pinprick and light touch intact throughout, bilaterally Deep Tendon Reflexes: 2+ and symmetric throughout Plantars: Right: downgoing   Left: downgoing Cerebellar: impaired finger to nose and heel to shin on right side     Lab Results: Basic Metabolic Panel: Recent Labs  Lab 06/11/19 2032  NA 141  K 4.0  CL 107  GLUCOSE 117*  BUN 24*  CREATININE 1.30*    CBC: Recent Labs  Lab 06/11/19 2024 06/11/19 2032  WBC 7.4  --   NEUTROABS 4.4  --   HGB 15.6 15.0  HCT 45.8 44.0  MCV 89.3  --   PLT 274  --     Coagulation Studies: No results for input(s): LABPROT, INR in the last 72 hours.  Imaging: CT HEAD CODE STROKE WO CONTRAST  Result Date: 06/11/2019 CLINICAL DATA:  Code stroke. Weakness, slurred speech, and facial droop. EXAM: CT HEAD WITHOUT CONTRAST TECHNIQUE: Contiguous axial images were obtained from the base of the skull through the vertex without intravenous contrast. COMPARISON:  None. FINDINGS: Brain:  There is no evidence of acute infarct, intracranial hemorrhage, mass, midline shift, or extra-axial fluid collection. The ventricles and sulci are normal. Vascular: Mild carotid siphon atherosclerosis. No hyperdense vessel. Skull: No fracture suspicious osseous lesion. Sinuses/Orbits: Prior treatment of right retinal detachment. Minimal right maxillary sinus mucosal thickening. Clear mastoid air cells. Other: None. ASPECTS [facility] Stroke Program Early CT Score) - Ganglionic level infarction (caudate, lentiform nuclei, internal capsule, insula, M1-M3 cortex): 7 - Supraganglionic infarction (M4-M6 cortex): 3 Total score (0-10 with 10 being normal): 10 IMPRESSION: 1. No evidence of acute intracranial abnormality. 2. ASPECTS is 10. These  results were communicated to Dr. Lorraine Lax at 8:42 pm on 06/11/2019 by text page via the [facility] messaging system. Electronically Signed   By: Logan Bores M.D.   On: 06/11/2019 20:42     ASSESSMENT AND PLAN  64 y.o. male with past medical history significant for atrial fibrillation aspirin 81 mg, hyperlipidemia, history of kidney stones presents as a code stroke with sudden onset slurred speech, dizziness and right-sided weakness that started 7:45 PM.  Acute Ischemic Stroke s/p IV TPA  Recommendations # MRI of the brain without contrast #Transthoracic Echo  #No AP upto 24 hrs after ivTPA,  # repeat CT head in 24hrs  #Start or continue Atorvastatin 40 mg/other high intensity statin # BP goal: permissive HTN upto 180/105 mmHg  # HBAIC and Lipid profile # Telemetry monitoring # Frequent neuro checks # Stroke swallow screen  Please page stroke NP  Or  PA  Or MD from 8am -4 pm  as this patient from this time will be  followed by the stroke.   You can look them up on [email or web address]  Password [email or web address]   Atrial Fibrillation - CHADSVASC2 score now increases to 2 - Will need to be started on [facility] after 24hrs from post tPA or later depending on size of stroke   Keira Bohlin [facility] Pager Number [phone]

## 2019-06-12 ENCOUNTER — Encounter (HOSPITAL_COMMUNITY): Payer: Self-pay | Admitting: Neurology

## 2019-06-12 ENCOUNTER — Inpatient Hospital Stay (HOSPITAL_COMMUNITY): Payer: BC Managed Care – PPO

## 2019-06-12 DIAGNOSIS — Z9282 Status post administration of tPA (rtPA) in a different facility within the last 24 hours prior to admission to current facility: Secondary | ICD-10-CM

## 2019-06-12 DIAGNOSIS — I639 Cerebral infarction, unspecified: Secondary | ICD-10-CM

## 2019-06-12 DIAGNOSIS — E7800 Pure hypercholesterolemia, unspecified: Secondary | ICD-10-CM

## 2019-06-12 DIAGNOSIS — I480 Paroxysmal atrial fibrillation: Secondary | ICD-10-CM

## 2019-06-12 DIAGNOSIS — I6389 Other cerebral infarction: Secondary | ICD-10-CM

## 2019-06-12 DIAGNOSIS — N179 Acute kidney failure, unspecified: Secondary | ICD-10-CM

## 2019-06-12 LAB — LIPID PANEL
Cholesterol: 184 mg/dL (ref 0–200)
HDL: 30 mg/dL — ABNORMAL LOW (ref >40)
LDL Cholesterol: 140 mg/dL — ABNORMAL HIGH (ref 0–99)
Total CHOL/HDL Ratio: 6.1 RATIO
Triglycerides: 68 mg/dL (ref <150)
VLDL: 14 mg/dL (ref 0–40)

## 2019-06-12 LAB — ECHOCARDIOGRAM COMPLETE
Height: 74 in
Weight: 3079.39 oz

## 2019-06-12 LAB — MRSA PCR SCREENING: MRSA by PCR: NEGATIVE

## 2019-06-12 LAB — HEMOGLOBIN A1C
Hgb A1c MFr Bld: 5.5 % (ref 4.8–5.6)
Mean Plasma Glucose: 111.15 mg/dL

## 2019-06-12 MED ORDER — PANTOPRAZOLE SODIUM 40 MG PO TBEC
40.0000 mg | DELAYED_RELEASE_TABLET | Freq: Every day | ORAL | Status: DC
  Administered 2019-06-13 – 2019-06-14 (×2): 40 mg via ORAL
  Filled 2019-06-12 (×2): qty 1

## 2019-06-12 MED ORDER — ONDANSETRON HCL 4 MG/2ML IJ SOLN
4.0000 mg | Freq: Four times a day (QID) | INTRAMUSCULAR | Status: DC | PRN
  Administered 2019-06-12 – 2019-06-13 (×2): 4 mg via INTRAVENOUS
  Filled 2019-06-12 (×2): qty 2

## 2019-06-12 MED ORDER — ATORVASTATIN CALCIUM 40 MG PO TABS
40.0000 mg | ORAL_TABLET | Freq: Every day | ORAL | Status: DC
  Filled 2019-06-12: qty 1

## 2019-06-12 MED ORDER — ACETAMINOPHEN 325 MG PO TABS
650.0000 mg | ORAL_TABLET | Freq: Once | ORAL | Status: AC
  Administered 2019-06-12: 325 mg via ORAL
  Filled 2019-06-12: qty 2

## 2019-06-12 MED ORDER — ROSUVASTATIN CALCIUM 20 MG PO TABS
20.0000 mg | ORAL_TABLET | Freq: Every day | ORAL | Status: DC
  Administered 2019-06-13: 20 mg via ORAL
  Filled 2019-06-12: qty 1

## 2019-06-12 NOTE — Evaluation (Signed)
Physical Therapy Evaluation Patient Details Name: Tyler Herrera MRN: [id number] DOB: 1955/11/05 Today's Date: 06/12/2019   History of Present Illness  [name] is a 64 y.o. male with history of atrial fibrillation, hyperlipidemia, kidney stones presenting with dysarthria, dizziness and R hemiparesis w/ ataxia.  Received tPA 06/11/2019 at 2044. CT head: thrombus at R PCA.  Clinical Impression  Pt is at baseline functioning and should be safe at home. There are no further acute PT needs.  Will sign off at this time.     Follow Up Recommendations No PT follow up    Equipment Recommendations  None recommended by PT    Recommendations for Other Services       Precautions / Restrictions Precautions Precautions: Fall Restrictions Weight Bearing Restrictions: No      Mobility  Bed Mobility Overal bed mobility: Modified Independent                Transfers Overall transfer level: Modified independent Equipment used: None                Ambulation/Gait Ambulation/Gait assistance: Independent Gait Distance (Feet): 500 Feet Assistive device: None Gait Pattern/deviations: WFL(Within Functional Limits)   Gait velocity interpretation: >4.37 ft/sec, indicative of normal walking speed General Gait Details: steady if with moderate balance challenge at a rapid speed.  Stairs Stairs: Yes Stairs assistance: Modified independent (Device/Increase time);Independent Stair Management: No rails;One rail Right;Alternating pattern;Forwards Number of Stairs: 12 General stair comments: safe no rails  Wheelchair Mobility    Modified Rankin (Stroke Patients Only) Modified Rankin (Stroke Patients Only) Pre-Morbid Rankin Score: No symptoms Modified Rankin: No symptoms     Balance Overall balance assessment: Independent                               Standardized Balance Assessment Standardized Balance Assessment : Dynamic Gait Index   Dynamic  Gait Index Level Surface: Normal Change in Gait Speed: Normal Gait with Horizontal Head Turns: Normal Gait with Vertical Head Turns: Normal Gait and Pivot Turn: Normal Step Over Obstacle: Normal Step Around Obstacles: Normal Steps: Normal Total Score: 24       Pertinent Vitals/Pain Pain Assessment: Faces Faces Pain Scale: Hurts a little bit Pain Location: headache Pain Descriptors / Indicators: Headache Pain Intervention(s): Monitored during session    Home Living Family/patient expects to be discharged to:: Private residence Living Arrangements: Spouse/significant other Available Help at Discharge: Family;Available 24 hours/day Type of Home: House Home Access: Stairs to enter   [employer] of Steps: 2 Home Layout: Two level Home Equipment: None      Prior Function Level of Independence: Independent         Comments: Works from home.     Hand Dominance   Dominant Hand: Right    Extremity/Trunk Assessment   Upper Extremity Assessment Upper Extremity Assessment: Overall WFL for tasks assessed    Lower Extremity Assessment Lower Extremity Assessment: Overall WFL for tasks assessed    Cervical / Trunk Assessment Cervical / Trunk Assessment: Normal  Communication   Communication: No difficulties  Cognition Arousal/Alertness: Awake/alert Behavior During Therapy: WFL for tasks assessed/performed Overall Cognitive Status: Within Functional Limits for tasks assessed                                        General Comments General comments (skin  integrity, edema, etc.): vss    Exercises     Assessment/Plan    PT Assessment Patent does not need any further PT services  PT Problem List         PT Treatment Interventions      PT Goals (Current goals can be found in the Care Plan section)  Acute Rehab PT Goals Patient Stated Goal: to go home PT Goal Formulation: All assessment and education complete, DC therapy     Frequency     Barriers to discharge        Co-evaluation               AM-PAC PT "6 Clicks" Mobility  Outcome Measure Help needed turning from your back to your side while in a flat bed without using bedrails?: None Help needed moving from lying on your back to sitting on the side of a flat bed without using bedrails?: None Help needed moving to and from a bed to a chair (including a wheelchair)?: None Help needed standing up from a chair using your arms (e.g., wheelchair or bedside chair)?: None Help needed to walk in hospital room?: None Help needed climbing 3-5 steps with a railing? : None 6 Click Score: 24    End of Session   Activity Tolerance: Patient tolerated treatment well Patient left: in bed;with family/visitor present Nurse Communication: Mobility status PT Visit Diagnosis: Unsteadiness on feet (R26.81)    Time: 1730-1751 PT Time Calculation (min) (ACUTE ONLY): 21 min   Charges:   PT Evaluation $PT Eval Low Complexity: 1 Low          06/12/2019  Ginger Carne., PT Acute Rehabilitation Services [phone]  (pager) [phone]  (office)  Tessie Fass Anmol Paschen 06/12/2019, 6:13 PM

## 2019-06-12 NOTE — Progress Notes (Signed)
STROKE TEAM PROGRESS NOTE   INTERVAL HISTORY No family is at the bedside.  Pt awake alert and stated that his slurry speech and right sided weakness are gone. Still has mild HA. On exam showed left upper quadrantanopia. Otherwise intact, pending MRI and MRA  Vitals:   06/12/19 0600 06/12/19 0630 06/12/19 0700 06/12/19 0800  BP: 115/70 138/73 121/66 104/63  Pulse: (!) 52 (!) 55 (!) 49 (!) 52  Resp: 11 16 15 12   Temp:    98.4 F (36.9 C)  TempSrc:    Oral  SpO2: 92% 95% 94% 96%  Weight:      Height:        CBC:  Recent Labs  Lab 06/11/19 2024 06/11/19 2032  WBC 7.4  --   NEUTROABS 4.4  --   HGB 15.6 15.0  HCT 45.8 44.0  MCV 89.3  --   PLT 274  --     Basic Metabolic Panel:  Recent Labs  Lab 06/11/19 2024 06/11/19 2032  NA 142 141  K 4.1 4.0  CL 110 107  CO2 23  --   GLUCOSE 123* 117*  BUN 23 24*  CREATININE 1.28* 1.30*  CALCIUM 9.3  --    Lipid Panel:     Component Value Date/Time   CHOL 184 06/12/2019 0347   CHOL 211 (H) 01/25/2019 1543   TRIG 68 06/12/2019 0347   HDL 30 (L) 06/12/2019 0347   HDL 31 (L) 01/25/2019 1543   CHOLHDL 6.1 06/12/2019 0347   VLDL 14 06/12/2019 0347   LDLCALC 140 (H) 06/12/2019 0347   LDLCALC 139 (H) 01/25/2019 1543   HgbA1c:  Lab Results  Component Value Date   HGBA1C 5.5 06/12/2019   Urine Drug Screen: No results found for: LABOPIA, COCAINSCRNUR, LABBENZ, AMPHETMU, THCU, LABBARB  Alcohol Level     Component Value Date/Time   ETH <10 06/11/2019 2037    IMAGING past 48 hours CT ANGIO HEAD W OR WO CONTRAST  Result Date: 06/11/2019 CLINICAL DATA:  64 year old male code stroke presentation today with weakness, slurred speech, facial droop. Status post IV tPA. EXAM: CT ANGIOGRAPHY HEAD AND NECK TECHNIQUE: Multidetector CT imaging of the head and neck was performed using the standard protocol during bolus administration of intravenous contrast. Multiplanar CT image reconstructions and MIPs were obtained to evaluate the  vascular anatomy. Carotid stenosis measurements (when applicable) are obtained utilizing NASCET criteria, using the distal internal carotid diameter as the denominator. CONTRAST:  18mL OMNIPAQUE IOHEXOL 350 MG/ML SOLN COMPARISON:  Plain head CT 2034 hours. FINDINGS: CTA NECK Skeleton: Areas of poor dentition. No acute osseous abnormality identified. Mild for age cervical spine degeneration. Upper chest: Negative; mild dependent atelectasis in the upper lungs. Other neck: No acute findings in the neck. Postinflammatory calcifications of the palatine tonsils. Aortic arch: 3 vessel arch configuration with minimal arch atherosclerosis. Right carotid system: Negative. Left carotid system: Mildly tortuous left CCA without plaque or stenosis. Minimal calcified plaque at the posterior left ICA bulb with widely patent left carotid bifurcation. No stenosis to the skull base. Vertebral arteries: No proximal right subclavian or right vertebral artery origin plaque or stenosis. Patent right vertebral artery to the skull base without stenosis. Mild if any proximal left subclavian artery plaque. Tortuous left subclavian with a kinked appearance at the thoracic inlet (series 8, image 163). Normal left vertebral artery origin. Tortuous left V1 segment. Codominant left vertebral artery is patent to the skull base without stenosis. CTA HEAD Posterior circulation: Patent distal  vertebral arteries with mild tortuosity. No stenosis. Normal PICA origins. Patent basilar artery to the SCA origins, but there is thrombus at the basilar tip and right PCA origin as seen on series 8, image 133 and series 7, image 118. Posterior communicating arteries are diminutive or absent. The right PCA appears to bifurcate early, also on image 118. And the branches remain patent despite the abnormality. The left PCA appears normal. Anterior circulation: Both ICA siphons are patent without plaque or stenosis. Patent carotid termini. Normal MCA and ACA  origins. Anterior communicating artery and bilateral ACA branches are within normal limits. Left MCA M1 segment and bifurcation are patent without stenosis. Left MCA branches are within normal limits. Right MCA M1 segment and bifurcation are patent without stenosis. Right MCA branches are within normal limits. Venous sinuses: Early contrast timing limits evaluation of the left transverse and sigmoid sinuses, but the remaining major dural venous sinuses are enhancing and appear to be patent. Anatomic variants: Apparent early right PCA branching. Review of the MIP images confirms the above findings IMPRESSION: 1. Positive for thrombus at the Right PCA origin and involving some of the right Basilar tip. However, the PCA seems to remain patent and also has an unusual early bifurcation. Posterior communicating arteries are diminutive or absent. 2. The remaining posterior circulation appears negative. The anterior circulation is negative. And there is minimal atherosclerosis in the head and neck. Salient findings discussed by telephone with Dr. Arther Dames on 06/11/2019 at 2159 hours. Electronically Signed   By: Odessa Fleming M.D.   On: 06/11/2019 22:07   CT ANGIO NECK W OR WO CONTRAST  Result Date: 06/11/2019 CLINICAL DATA:  64 year old male code stroke presentation today with weakness, slurred speech, facial droop. Status post IV tPA. EXAM: CT ANGIOGRAPHY HEAD AND NECK TECHNIQUE: Multidetector CT imaging of the head and neck was performed using the standard protocol during bolus administration of intravenous contrast. Multiplanar CT image reconstructions and MIPs were obtained to evaluate the vascular anatomy. Carotid stenosis measurements (when applicable) are obtained utilizing NASCET criteria, using the distal internal carotid diameter as the denominator. CONTRAST:  45mL OMNIPAQUE IOHEXOL 350 MG/ML SOLN COMPARISON:  Plain head CT 2034 hours. FINDINGS: CTA NECK Skeleton: Areas of poor dentition. No acute osseous  abnormality identified. Mild for age cervical spine degeneration. Upper chest: Negative; mild dependent atelectasis in the upper lungs. Other neck: No acute findings in the neck. Postinflammatory calcifications of the palatine tonsils. Aortic arch: 3 vessel arch configuration with minimal arch atherosclerosis. Right carotid system: Negative. Left carotid system: Mildly tortuous left CCA without plaque or stenosis. Minimal calcified plaque at the posterior left ICA bulb with widely patent left carotid bifurcation. No stenosis to the skull base. Vertebral arteries: No proximal right subclavian or right vertebral artery origin plaque or stenosis. Patent right vertebral artery to the skull base without stenosis. Mild if any proximal left subclavian artery plaque. Tortuous left subclavian with a kinked appearance at the thoracic inlet (series 8, image 163). Normal left vertebral artery origin. Tortuous left V1 segment. Codominant left vertebral artery is patent to the skull base without stenosis. CTA HEAD Posterior circulation: Patent distal vertebral arteries with mild tortuosity. No stenosis. Normal PICA origins. Patent basilar artery to the SCA origins, but there is thrombus at the basilar tip and right PCA origin as seen on series 8, image 133 and series 7, image 118. Posterior communicating arteries are diminutive or absent. The right PCA appears to bifurcate early, also on image 118. And  the branches remain patent despite the abnormality. The left PCA appears normal. Anterior circulation: Both ICA siphons are patent without plaque or stenosis. Patent carotid termini. Normal MCA and ACA origins. Anterior communicating artery and bilateral ACA branches are within normal limits. Left MCA M1 segment and bifurcation are patent without stenosis. Left MCA branches are within normal limits. Right MCA M1 segment and bifurcation are patent without stenosis. Right MCA branches are within normal limits. Venous sinuses: Early  contrast timing limits evaluation of the left transverse and sigmoid sinuses, but the remaining major dural venous sinuses are enhancing and appear to be patent. Anatomic variants: Apparent early right PCA branching. Review of the MIP images confirms the above findings IMPRESSION: 1. Positive for thrombus at the Right PCA origin and involving some of the right Basilar tip. However, the PCA seems to remain patent and also has an unusual early bifurcation. Posterior communicating arteries are diminutive or absent. 2. The remaining posterior circulation appears negative. The anterior circulation is negative. And there is minimal atherosclerosis in the head and neck. Salient findings discussed by telephone with Dr. Samara Snide on 06/11/2019 at 2159 hours. Electronically Signed   By: Genevie Ann M.D.   On: 06/11/2019 22:07   CT HEAD CODE STROKE WO CONTRAST  Result Date: 06/11/2019 CLINICAL DATA:  Code stroke. Weakness, slurred speech, and facial droop. EXAM: CT HEAD WITHOUT CONTRAST TECHNIQUE: Contiguous axial images were obtained from the base of the skull through the vertex without intravenous contrast. COMPARISON:  None. FINDINGS: Brain: There is no evidence of acute infarct, intracranial hemorrhage, mass, midline shift, or extra-axial fluid collection. The ventricles and sulci are normal. Vascular: Mild carotid siphon atherosclerosis. No hyperdense vessel. Skull: No fracture suspicious osseous lesion. Sinuses/Orbits: Prior treatment of right retinal detachment. Minimal right maxillary sinus mucosal thickening. Clear mastoid air cells. Other: None. ASPECTS [facility] Stroke Program Early CT Score) - Ganglionic level infarction (caudate, lentiform nuclei, internal capsule, insula, M1-M3 cortex): 7 - Supraganglionic infarction (M4-M6 cortex): 3 Total score (0-10 with 10 being normal): 10 IMPRESSION: 1. No evidence of acute intracranial abnormality. 2. ASPECTS is 10. These results were communicated to Dr. Lorraine Lax at 8:42  pm on 06/11/2019 by text page via the [facility] messaging system. Electronically Signed   By: Logan Bores M.D.   On: 06/11/2019 20:42    PHYSICAL EXAM  Temp:  [97.5 F (36.4 C)-98.4 F (36.9 C)] 98.4 F (36.9 C) (01/26 0800) Pulse Rate:  [49-65] 62 (01/26 0900) Resp:  [11-25] 22 (01/26 0900) BP: (82-158)/(42-106) 158/106 (01/26 0900) SpO2:  [92 %-98 %] 96 % (01/26 0900) Weight:  [87.3 kg] 87.3 kg (01/25 2220)  General - Well nourished, well developed, in no apparent distress.  Ophthalmologic - fundi not visualized due to noncooperation.  Cardiovascular - Regular rhythm and rate, not in afib but frequent PACs on tele.  Mental Status -  Level of arousal and orientation to time, place, and person were intact. Language including expression, naming, repetition, comprehension was assessed and found intact. Fund of Knowledge was assessed and was intact.  Cranial Nerves II - XII - II - Visual field showed left upper quadrantanopia III, IV, VI - Extraocular movements intact. V - Facial sensation intact bilaterally. VII - Facial movement intact bilaterally. VIII - Hearing & vestibular intact bilaterally. X - Palate elevates symmetrically. XI - Chin turning & shoulder shrug intact bilaterally. XII - Tongue protrusion intact.  Motor Strength - The patient's strength was normal in all extremities and pronator drift was  absent.  Bulk was normal and fasciculations were absent.   Motor Tone - Muscle tone was assessed at the neck and appendages and was normal.  Reflexes - The patient's reflexes were symmetrical in all extremities and he had no pathological reflexes.  Sensory - Light touch, temperature/pinprick were assessed and were symmetrical.    Coordination - The patient had normal movements in the hands and feet with no ataxia or dysmetria.  Tremor was absent.  Gait and Station - deferred.   ASSESSMENT/PLAN Mr. Ravon Mortellaro is a 65 y.o. male with history of atrial fibrillation,  hyperlipidemia, kidney stones presenting with dysarthria, dizziness and R hemiparesis w/ ataxia.  Received tPA 06/11/2019 at 2044.  Stroke:   R PCA infarct embolic secondary to known AF not on [facility]  Code Stroke CT head No acute abnormality. ASPECTS 10.     CTA head & neck thrombus R PCA and basilar tip. PCA still with flow. Minimal atherosclerosis head and neck.  MRI  pending  MRA  pending  2D Echo pending  LDL 140  HgbA1c 5.5  UDS pending   SCDs for VTE prophylaxis  aspirin 81 mg daily prior to admission, now on No antithrombotic as within 24h of tPA administration.  Therapy recommendations:  pending   Disposition:  pending   Atrial Fibrillation  Home anticoagulation:  none, on aspirin 81 due to low CHADSVASC2 score . CHADSVASC2 score now 2 d/t stroke, candidate for [facility]  . Pt follows with Dr. Johney Frame, was on Eliquis before but took off as per pt request . Denies side effects from eliquis . Pt willing to resume eliquis when needed   Blood Pressure  No hx HTN, No Home meds  Stable BP goal per post tPA protocol x 24h following tPA administration  Hyperlipidemia  Home meds:  No statin listed  Now on lipitor 40  LDL 140, goal < 70  Continue statin at discharge  Other Stroke Risk Factors  ETOH use, alcohol level <10, advised to drink no more than 2 drink(s) a day  Other Active Problems  AKI, Cre 1.28 - on IVF and encourage po intake  Hospital day # 1  This patient is critically ill due to stroke, thrombus at [facility] tip, s/p tPA, hx of afib and at significant risk of neurological worsening, death form hemorrhage, hemorrhagic conversion, heart failure. This patient's care requires constant monitoring of vital signs, hemodynamics, respiratory and cardiac monitoring, review of multiple databases, neurological assessment, discussion with family, other specialists and medical decision making of high complexity. I spent 35 minutes of neurocritical care time in the care of  this patient.  Marvel Plan, MD PhD Stroke Neurology 06/12/2019 9:55 AM    To contact Stroke Continuity provider, please refer to [email or web address]. After hours, contact General Neurology

## 2019-06-12 NOTE — Progress Notes (Signed)
  Echocardiogram 2D Echocardiogram has been performed.  Leta Jungling M 06/12/2019, 11:23 AM

## 2019-06-12 NOTE — Progress Notes (Signed)
Patient reporting increase in headache pain. Dr. Laurence Slate of neurology paged. MD made aware that MRI has been completed, and that patient is neuro intact with NIH of 0. MD made aware that patient has received tylenol recently. Verbal orders to give another 650 mg dose of tylenol. This RN also checked with MD to see if patient is able to transfer out. Dr. Laurence Slate said that we can transfer patient out of ICU. Will continue to monitor.

## 2019-06-12 NOTE — Progress Notes (Addendum)
Updated Neuro MD Aroor that patient is currently NIH of 0 and neuro intact. Patient passed swallow screen. MD gave verbal order to advance diet to regular diet thin liquids. Also updated Neuro MD Aroor that I have attempted to get the MRI done, but MRI has been unable to to see the patient this shift so far. MD Aroor said due to patient currently being stable neurologically, that it is okay if MRI is not obtained this shift. Will continue to monitor.

## 2019-06-12 NOTE — Evaluation (Signed)
Occupational Therapy Evaluation Patient Details Name: Tyler Herrera MRN: [id number] DOB: 1955-12-28 Today's Date: 06/12/2019    History of Present Illness [name] is a 64 y.o. male with history of atrial fibrillation, hyperlipidemia, kidney stones presenting with dysarthria, dizziness and R hemiparesis w/ ataxia.  Received tPA 06/11/2019 at 2044. CT head: thrombus at R PCA.   Clinical Impression   Pt PTA: Pt living with family and reports independence with ADL and mobility; works from home. Pt currently, pt with no focal deficits. Pt's visual scanning is WFLs. 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 Pt performing ADL and mobility with modified independence only assist for lines. Pt at functional baseline. Pt does not require continued OT skilled services. OT signing off.    Follow Up Recommendations  No OT follow up    Equipment Recommendations  None recommended by OT    Recommendations for Other Services       Precautions / Restrictions Precautions Precautions: Fall Restrictions Weight Bearing Restrictions: No      Mobility Bed Mobility Overal bed mobility: Modified Independent                Transfers Overall transfer level: Modified independent Equipment used: None                  Balance Overall balance assessment: No apparent balance deficits (not formally assessed)                                         ADL either performed or assessed with clinical judgement   ADL Overall ADL's : Modified independent                                       General ADL Comments: Pt requires assist only for lines, otherwise, pt is independent with ADL and mobility. Pt standing for LB dressing task and stood at sink for light ADL.     Vision Baseline Vision/History: (detached retina in R eye) Patient Visual Report: No change from baseline Vision Assessment?: Yes Eye Alignment: Within  Functional Limits Ocular Range of Motion: Within Functional Limits Alignment/Gaze Preference: Within Defined Limits Tracking/Visual Pursuits: Able to track stimulus in all quads without difficulty     Perception     Praxis      Pertinent Vitals/Pain Pain Assessment: 0-10 Pain Score: 2  Pain Location: headache Pain Descriptors / Indicators: Headache Pain Intervention(s): Monitored during session     Hand Dominance Right   Extremity/Trunk Assessment Upper Extremity Assessment Upper Extremity Assessment: Overall WFL for tasks assessed   Lower Extremity Assessment Lower Extremity Assessment: Overall WFL for tasks assessed   Cervical / Trunk Assessment Cervical / Trunk Assessment: Normal   Communication     Cognition Arousal/Alertness: Awake/alert Behavior During Therapy: WFL for tasks assessed/performed Overall Cognitive Status: Within Functional Limits for tasks assessed                                     General Comments  VSS. HR 90-114 BPM; BP 158/79 after exertion    Exercises     Shoulder Instructions      Home Living Family/patient expects to be discharged to:: Private residence Living Arrangements: Spouse/significant other  Available Help at Discharge: Family;Available 24 hours/day Type of Home: House Home Access: Stairs to enter [employer] of Steps: 2   Home Layout: Two level Alternate Level Stairs-Number of Steps: flight   Bathroom Shower/Tub: Teacher, early years/pre: Standard     Home Equipment: None          Prior Functioning/Environment Level of Independence: Independent        Comments: Works from home.        OT Problem List:        OT Treatment/Interventions:      OT Goals(Current goals can be found in the care plan section) Acute Rehab OT Goals Patient Stated Goal: to go home  OT Frequency:     Barriers to D/C:            Co-evaluation              AM-PAC OT "6 Clicks"  Daily Activity     Outcome Measure Help from another person eating meals?: None Help from another person taking care of personal grooming?: None Help from another person toileting, which includes using toliet, bedpan, or urinal?: None Help from another person bathing (including washing, rinsing, drying)?: None Help from another person to put on and taking off regular upper body clothing?: None Help from another person to put on and taking off regular lower body clothing?: None 6 Click Score: 24   End of Session Nurse Communication: Mobility status  Activity Tolerance: Patient tolerated treatment well Patient left: in chair;with call bell/phone within reach;with family/visitor present  OT Visit Diagnosis: Unsteadiness on feet (R26.81);Pain Pain - part of body: (headache)                Time: 8185-6314 OT Time Calculation (min): 35 min Charges:  OT General Charges $OT Visit: 1 Visit OT Evaluation $OT Eval Moderate Complexity: 1 Mod OT Treatments $Self Care/Home Management : 8-22 mins  Tyler Herrera OTR/L Acute Rehabilitation Services Pager: [phone] Office: [phone]   Tyler Herrera C 06/12/2019, 2:57 PM

## 2019-06-12 NOTE — Progress Notes (Addendum)
Dr. Laurence Slate paged about patient having nausea. Neuro exam unchanged, NIH currently 0. New verbal orders received for zofran 4 mg every 6 hours PRN IV. Will continue to monitor.

## 2019-06-12 NOTE — Progress Notes (Signed)
Patient has not voided on shift. Bladder scan revealed 587 mL in patient's bladder. Patient states he is not in pain or discomfort. Patient states he wishes to have more time to try to void on his own, rather than getting an order and performing an intermittent catheter at this time. Will continue to monitor.

## 2019-06-13 DIAGNOSIS — E785 Hyperlipidemia, unspecified: Secondary | ICD-10-CM | POA: Diagnosis present

## 2019-06-13 DIAGNOSIS — I10 Essential (primary) hypertension: Secondary | ICD-10-CM | POA: Diagnosis present

## 2019-06-13 LAB — BASIC METABOLIC PANEL
Anion gap: 8 (ref 5–15)
BUN: 20 mg/dL (ref 8–23)
CO2: 25 mmol/L (ref 22–32)
Calcium: 8.8 mg/dL — ABNORMAL LOW (ref 8.9–10.3)
Chloride: 106 mmol/L (ref 98–111)
Creatinine, Ser: 1.02 mg/dL (ref 0.61–1.24)
GFR calc Af Amer: >60 mL/min (ref >60)
GFR calc non Af Amer: >60 mL/min (ref >60)
Glucose, Bld: 120 mg/dL — ABNORMAL HIGH (ref 70–99)
Potassium: 4 mmol/L (ref 3.5–5.1)
Sodium: 139 mmol/L (ref 135–145)

## 2019-06-13 LAB — URINALYSIS, ROUTINE W REFLEX MICROSCOPIC
Bilirubin Urine: NEGATIVE
Glucose, UA: NEGATIVE mg/dL
Hgb urine dipstick: NEGATIVE
Ketones, ur: 5 mg/dL — AB
Leukocytes,Ua: NEGATIVE
Nitrite: NEGATIVE
Protein, ur: NEGATIVE mg/dL
Specific Gravity, Urine: 1.021 (ref 1.005–1.030)
pH: 6 (ref 5.0–8.0)

## 2019-06-13 LAB — CBC
HCT: 43 % (ref 39.0–52.0)
Hemoglobin: 14.6 g/dL (ref 13.0–17.0)
MCH: 30.2 pg (ref 26.0–34.0)
MCHC: 34 g/dL (ref 30.0–36.0)
MCV: 88.8 fL (ref 80.0–100.0)
Platelets: 230 10*3/uL (ref 150–400)
RBC: 4.84 MIL/uL (ref 4.22–5.81)
RDW: 12 % (ref 11.5–15.5)
WBC: 11.3 10*3/uL — ABNORMAL HIGH (ref 4.0–10.5)
nRBC: 0 % (ref 0.0–0.2)

## 2019-06-13 LAB — RAPID URINE DRUG SCREEN, HOSP PERFORMED
Amphetamines: NOT DETECTED
Barbiturates: POSITIVE — AB
Benzodiazepines: NOT DETECTED
Cocaine: NOT DETECTED
Opiates: NOT DETECTED
Tetrahydrocannabinol: NOT DETECTED

## 2019-06-13 MED ORDER — METOPROLOL TARTRATE 5 MG/5ML IV SOLN
5.0000 mg | Freq: Once | INTRAVENOUS | Status: AC
  Administered 2019-06-14: 5 mg via INTRAVENOUS
  Filled 2019-06-13: qty 5

## 2019-06-13 MED ORDER — BUTALBITAL-APAP-CAFFEINE 50-325-40 MG PO TABS
1.0000 | ORAL_TABLET | Freq: Two times a day (BID) | ORAL | Status: DC | PRN
  Administered 2019-06-13 – 2019-06-14 (×2): 1 via ORAL
  Filled 2019-06-13 (×2): qty 1

## 2019-06-13 MED ORDER — ASPIRIN EC 325 MG PO TBEC
325.0000 mg | DELAYED_RELEASE_TABLET | Freq: Every day | ORAL | Status: DC
  Administered 2019-06-13 – 2019-06-14 (×2): 325 mg via ORAL
  Filled 2019-06-13 (×2): qty 1

## 2019-06-13 NOTE — Evaluation (Signed)
Speech Language Pathology Evaluation Patient Details Name: Tyler Herrera MRN: [id number] DOB: 08-Dec-1955 Today's Date: 06/13/2019 Time: 3748-2707 SLP Time Calculation (min) (ACUTE ONLY): 30 min  Problem List:  Patient Active Problem List   Diagnosis Date Noted  . Received intravenous tissue plasminogen activator (tPA) in emergency department   . Ischemic stroke (HCC) 06/11/2019  . Atrial fibrillation (HCC) 06/06/2013   Past Medical History:  Past Medical History:  Diagnosis Date  . Atrial fibrillation (HCC) "few years ago"   pt started on aspirin 81 mg  . History of kidney stones   . Hyperlipidemia   . Nephrolithiasis    kidney stones  . Persistent atrial fibrillation (HCC) 05/31/2013   Past Surgical History:  Past Surgical History:  Procedure Laterality Date  . CARDIOVERSION N/A 08/12/2016   Procedure: CARDIOVERSION;  Surgeon: Wendall Stade, MD;  Location: [facility] ENDOSCOPY;  Service: Cardiovascular;  Laterality: N/A;  . EYE SURGERY     detached retina R eye, repaired 2020  . none    . PARS PLANA VITRECTOMY Right 12/15/2018   Procedure: PARS PLANA VITRECTOMY WITH 25 GAUGE, ENDOLASER, REMOVAL OF SUBRETINAL  BANDS, DRAINAGE OF SUBRETINAL FLUID,PERFLOURON AND SILICONE OIL TAMPONADE;  Surgeon: Carmela Rima, MD;  Location: [facility] OR;  Service: Ophthalmology;  Laterality: Right;  . 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 is a 64 y.o. male with history of atrial fibrillation, hyperlipidemia, kidney stones presenting with dysarthria, dizziness and R hemiparesis w/ ataxia.  Received tPA 06/11/2019 at 2044. CT head: thrombus at R PCA. Per MD (1/26), Pt awake alert and stated that his slurry speech and right sided weakness are gone.   Assessment / Plan /  Recommendation Clinical Impression  Pt alert and cooperative t/o session. SLP administered the COGNISTAT to assess current cognitive-lingusitc functioning. His overall cognitive status, auditory comprehension, verbal expression and motor speech were within functional limits. Per pt report, he is functioning at his baseline as well. No further SLP services needed, but recommend intermittent supervision as a precaution upon return home.    SLP Assessment  SLP Recommendation/Assessment: Patient does not need any further Speech Lanaguage Pathology Services SLP Visit Diagnosis: Cognitive communication deficit (R41.841)    Follow Up Recommendations  Other (comment)(intermitten supervision)    Frequency and Duration           SLP Evaluation Cognition  Overall Cognitive Status: Within Functional Limits for tasks assessed      Comprehension  Auditory Comprehension Overall Auditory Comprehension: Appears within functional limits for tasks assessed    Expression Verbal Expression Overall Verbal Expression: Appears within functional limits for tasks assessed   Oral / Motor  Oral Motor/Sensory Function Overall Oral Motor/Sensory Function: Within functional limits Motor Speech Overall Motor Speech: Appears within functional limits for tasks assessed   GO                   Tyler Herrera, Student SLP Office: [phone]  06/13/2019, 9:36 AM

## 2019-06-13 NOTE — Discharge Summary (Addendum)
Stroke Discharge Summary  Patient ID: Tyler Herrera   MRN: [id number]      DOB: 1955/12/04  Date of Admission: 06/11/2019 Date of Discharge: 06/14/2019  Attending Physician:  Rosalin Hawking, MD, Stroke MD Consultant(s):    None  Patient's PCP:  Leanna Battles, MD  DISCHARGE DIAGNOSIS:  Principal Problem:   Ischemic stroke ([place]) - R ACA, embolic d/t AF not on [facility] Active Problems:   Atrial fibrillation ([place])   Received intravenous tissue plasminogen activator (tPA) in emergency department   Essential hypertension   Hyperlipidemia LDL goal <70   Allergies as of 06/14/2019   No Known Allergies     Medication List    STOP taking these medications   losartan 25 MG tablet Commonly known as: COZAAR     TAKE these medications   apixaban 5 MG Tabs tablet Commonly known as: Eliquis Take 1 tablet (5 mg total) by mouth 2 (two) times daily.   aspirin 325 MG EC tablet Take 1 tablet (325 mg total) by mouth daily for 2 doses. STOP ASPIRIN 1/31 WHEN YOU START ELIQUIS Start taking on: June 15, 2019   butalbital-acetaminophen-caffeine 50-325-40 MG tablet Commonly known as: FIORICET Take 1 tablet by mouth every 12 (twelve) hours as needed for headache.   ketorolac 0.5 % ophthalmic solution Commonly known as: ACULAR Place 1 drop into the right eye 4 (four) times daily.   prednisoLONE acetate 1 % ophthalmic suspension Commonly known as: PRED FORTE Place 1 drop into the right eye 4 (four) times daily.   rosuvastatin 20 MG tablet Commonly known as: CRESTOR Take 1 tablet (20 mg total) by mouth daily at 6 PM.   sotalol 120 MG tablet Commonly known as: BETAPACE Take 1 tablet (120 mg total) by mouth every 12 (twelve) hours.       LABORATORY STUDIES CBC    Component Value Date/Time   WBC 7.7 06/14/2019 0241   RBC 4.95 06/14/2019 0241   HGB 14.8 06/14/2019 0241   HGB 14.9 09/13/2016 1442   HCT 43.8 06/14/2019 0241   HCT 43.1 09/13/2016 1442   PLT 246 06/14/2019 0241    PLT 288 09/13/2016 1442   MCV 88.5 06/14/2019 0241   MCV 88 09/13/2016 1442   MCH 29.9 06/14/2019 0241   MCHC 33.8 06/14/2019 0241   RDW 12.0 06/14/2019 0241   RDW 13.6 09/13/2016 1442   LYMPHSABS 2.0 06/11/2019 2024   LYMPHSABS 1.5 09/13/2016 1442   MONOABS 0.8 06/11/2019 2024   EOSABS 0.2 06/11/2019 2024   EOSABS 0.2 09/13/2016 1442   BASOSABS 0.1 06/11/2019 2024   BASOSABS 0.1 09/13/2016 1442   CMP    Component Value Date/Time   NA 140 06/14/2019 0241   NA 141 01/25/2019 1543   K 3.7 06/14/2019 0241   CL 108 06/14/2019 0241   CO2 23 06/14/2019 0241   GLUCOSE 109 (H) 06/14/2019 0241   BUN 13 06/14/2019 0241   BUN 20 01/25/2019 1543   CREATININE 0.89 06/14/2019 0241   CALCIUM 9.0 06/14/2019 0241   PROT 6.4 (L) 06/11/2019 2024   PROT 7.0 01/25/2019 1543   ALBUMIN 4.2 06/11/2019 2024   ALBUMIN 5.1 (H) 01/25/2019 1543   AST 23 06/11/2019 2024   ALT 27 06/11/2019 2024   ALKPHOS 51 06/11/2019 2024   BILITOT 0.4 06/11/2019 2024   BILITOT 0.5 01/25/2019 1543   GFRNONAA >60 06/14/2019 0241   GFRAA >60 06/14/2019 0241   COAGS Lab Results  Component Value Date  INR 1.0 06/11/2019   Lipid Panel    Component Value Date/Time   CHOL 184 06/12/2019 0347   CHOL 211 (H) 01/25/2019 1543   TRIG 68 06/12/2019 0347   HDL 30 (L) 06/12/2019 0347   HDL 31 (L) 01/25/2019 1543   CHOLHDL 6.1 06/12/2019 0347   VLDL 14 06/12/2019 0347   LDLCALC 140 (H) 06/12/2019 0347   LDLCALC 139 (H) 01/25/2019 1543   HgbA1C  Lab Results  Component Value Date   HGBA1C 5.5 06/12/2019   Urinalysis    Component Value Date/Time   COLORURINE YELLOW 06/13/2019 1950   APPEARANCEUR CLEAR 06/13/2019 1950   LABSPEC 1.021 06/13/2019 1950   PHURINE 6.0 06/13/2019 1950   GLUCOSEU NEGATIVE 06/13/2019 1950   HGBUR NEGATIVE 06/13/2019 1950   BILIRUBINUR NEGATIVE 06/13/2019 1950   KETONESUR 5 (A) 06/13/2019 1950   PROTEINUR NEGATIVE 06/13/2019 1950   NITRITE NEGATIVE 06/13/2019 1950    LEUKOCYTESUR NEGATIVE 06/13/2019 1950   Urine Drug Screen     Component Value Date/Time   LABOPIA NONE DETECTED 06/13/2019 1955   COCAINSCRNUR NONE DETECTED 06/13/2019 1955   LABBENZ NONE DETECTED 06/13/2019 1955   AMPHETMU NONE DETECTED 06/13/2019 1955   THCU NONE DETECTED 06/13/2019 1955   LABBARB POSITIVE (A) 06/13/2019 1955    Alcohol Level    Component Value Date/Time   ETH <10 06/11/2019 2037     SIGNIFICANT DIAGNOSTIC STUDIES CT ANGIO HEAD W OR WO CONTRAST  Result Date: 06/11/2019 CLINICAL DATA:  64 year old male code stroke presentation today with weakness, slurred speech, facial droop. Status post IV tPA. EXAM: CT ANGIOGRAPHY HEAD AND NECK TECHNIQUE: Multidetector CT imaging of the head and neck was performed using the standard protocol during bolus administration of intravenous contrast. Multiplanar CT image reconstructions and MIPs were obtained to evaluate the vascular anatomy. Carotid stenosis measurements (when applicable) are obtained utilizing NASCET criteria, using the distal internal carotid diameter as the denominator. CONTRAST:  75mL OMNIPAQUE IOHEXOL 350 MG/ML SOLN COMPARISON:  Plain head CT 2034 hours. FINDINGS: CTA NECK Skeleton: Areas of poor dentition. No acute osseous abnormality identified. Mild for age cervical spine degeneration. Upper chest: Negative; mild dependent atelectasis in the upper lungs. Other neck: No acute findings in the neck. Postinflammatory calcifications of the palatine tonsils. Aortic arch: 3 vessel arch configuration with minimal arch atherosclerosis. Right carotid system: Negative. Left carotid system: Mildly tortuous left CCA without plaque or stenosis. Minimal calcified plaque at the posterior left ICA bulb with widely patent left carotid bifurcation. No stenosis to the skull base. Vertebral arteries: No proximal right subclavian or right vertebral artery origin plaque or stenosis. Patent right vertebral artery to the skull base without  stenosis. Mild if any proximal left subclavian artery plaque. Tortuous left subclavian with a kinked appearance at the thoracic inlet (series 8, image 163). Normal left vertebral artery origin. Tortuous left V1 segment. Codominant left vertebral artery is patent to the skull base without stenosis. CTA HEAD Posterior circulation: Patent distal vertebral arteries with mild tortuosity. No stenosis. Normal PICA origins. Patent basilar artery to the SCA origins, but there is thrombus at the basilar tip and right PCA origin as seen on series 8, image 133 and series 7, image 118. Posterior communicating arteries are diminutive or absent. The right PCA appears to bifurcate early, also on image 118. And the branches remain patent despite the abnormality. The left PCA appears normal. Anterior circulation: Both ICA siphons are patent without plaque or stenosis. Patent carotid termini. Normal MCA and ACA  origins. Anterior communicating artery and bilateral ACA branches are within normal limits. Left MCA M1 segment and bifurcation are patent without stenosis. Left MCA branches are within normal limits. Right MCA M1 segment and bifurcation are patent without stenosis. Right MCA branches are within normal limits. Venous sinuses: Early contrast timing limits evaluation of the left transverse and sigmoid sinuses, but the remaining major dural venous sinuses are enhancing and appear to be patent. Anatomic variants: Apparent early right PCA branching. Review of the MIP images confirms the above findings IMPRESSION: 1. Positive for thrombus at the Right PCA origin and involving some of the right Basilar tip. However, the PCA seems to remain patent and also has an unusual early bifurcation. Posterior communicating arteries are diminutive or absent. 2. The remaining posterior circulation appears negative. The anterior circulation is negative. And there is minimal atherosclerosis in the head and neck. Salient findings discussed by  telephone with Dr. Arther Dames on 06/11/2019 at 2159 hours. Electronically Signed   By: Odessa Fleming M.D.   On: 06/11/2019 22:07   CT ANGIO NECK W OR WO CONTRAST  Result Date: 06/11/2019 CLINICAL DATA:  64 year old male code stroke presentation today with weakness, slurred speech, facial droop. Status post IV tPA. EXAM: CT ANGIOGRAPHY HEAD AND NECK TECHNIQUE: Multidetector CT imaging of the head and neck was performed using the standard protocol during bolus administration of intravenous contrast. Multiplanar CT image reconstructions and MIPs were obtained to evaluate the vascular anatomy. Carotid stenosis measurements (when applicable) are obtained utilizing NASCET criteria, using the distal internal carotid diameter as the denominator. CONTRAST:  75mL OMNIPAQUE IOHEXOL 350 MG/ML SOLN COMPARISON:  Plain head CT 2034 hours. FINDINGS: CTA NECK Skeleton: Areas of poor dentition. No acute osseous abnormality identified. Mild for age cervical spine degeneration. Upper chest: Negative; mild dependent atelectasis in the upper lungs. Other neck: No acute findings in the neck. Postinflammatory calcifications of the palatine tonsils. Aortic arch: 3 vessel arch configuration with minimal arch atherosclerosis. Right carotid system: Negative. Left carotid system: Mildly tortuous left CCA without plaque or stenosis. Minimal calcified plaque at the posterior left ICA bulb with widely patent left carotid bifurcation. No stenosis to the skull base. Vertebral arteries: No proximal right subclavian or right vertebral artery origin plaque or stenosis. Patent right vertebral artery to the skull base without stenosis. Mild if any proximal left subclavian artery plaque. Tortuous left subclavian with a kinked appearance at the thoracic inlet (series 8, image 163). Normal left vertebral artery origin. Tortuous left V1 segment. Codominant left vertebral artery is patent to the skull base without stenosis. CTA HEAD Posterior circulation:  Patent distal vertebral arteries with mild tortuosity. No stenosis. Normal PICA origins. Patent basilar artery to the SCA origins, but there is thrombus at the basilar tip and right PCA origin as seen on series 8, image 133 and series 7, image 118. Posterior communicating arteries are diminutive or absent. The right PCA appears to bifurcate early, also on image 118. And the branches remain patent despite the abnormality. The left PCA appears normal. Anterior circulation: Both ICA siphons are patent without plaque or stenosis. Patent carotid termini. Normal MCA and ACA origins. Anterior communicating artery and bilateral ACA branches are within normal limits. Left MCA M1 segment and bifurcation are patent without stenosis. Left MCA branches are within normal limits. Right MCA M1 segment and bifurcation are patent without stenosis. Right MCA branches are within normal limits. Venous sinuses: Early contrast timing limits evaluation of the left transverse and sigmoid sinuses,  but the remaining major dural venous sinuses are enhancing and appear to be patent. Anatomic variants: Apparent early right PCA branching. Review of the MIP images confirms the above findings IMPRESSION: 1. Positive for thrombus at the Right PCA origin and involving some of the right Basilar tip. However, the PCA seems to remain patent and also has an unusual early bifurcation. Posterior communicating arteries are diminutive or absent. 2. The remaining posterior circulation appears negative. The anterior circulation is negative. And there is minimal atherosclerosis in the head and neck. Salient findings discussed by telephone with Dr. Arther Dames on 06/11/2019 at 2159 hours. Electronically Signed   By: Odessa Fleming M.D.   On: 06/11/2019 22:07   MR ANGIO HEAD WO CONTRAST  Result Date: 06/12/2019 CLINICAL DATA:  Initial evaluation for acute stroke. EXAM: MRI HEAD WITHOUT CONTRAST MRA HEAD WITHOUT CONTRAST TECHNIQUE: Multiplanar, multiecho pulse  sequences of the brain and surrounding structures were obtained without intravenous contrast. Angiographic images of the head were obtained using MRA technique without contrast. COMPARISON:  Prior CTs from 06/11/2019. FINDINGS: MRI HEAD FINDINGS Brain: Cerebral volume within normal limits. Minimal scattered cerebral white matter change for age. Moderate-sized acute ischemic right PCA territory infarct involving the parasagittal right temporal occipital region. Associated petechial hemorrhage without frank hemorrhagic transformation (series 14, image 24). No significant mass effect. No other evidence for acute or subacute ischemia. Gray-white matter differentiation otherwise maintained. No other areas of remote cortical infarction. No other evidence for acute or chronic intracranial hemorrhage. No mass lesion, midline shift or mass effect. No hydrocephalus. No extra-axial fluid collection. Abnormal appearance of the pituitary gland with T1 hyperintensity seen extending from the anterior aspect of the gland into the suprasellar region (series 9, image 11). Midline structures otherwise intact and normal. Vascular: Major intracranial vascular flow voids are maintained. Skull and upper cervical spine: Craniocervical junction normal. Upper cervical spine within normal limits. Bone marrow signal intensity within normal limits. No scalp soft tissue abnormality. Sinuses/Orbits: Postoperative changes noted at the right globe. Left globe within normal limits. Orbital soft tissues unremarkable. Paranasal sinuses are clear. No mastoid effusion. Inner ear structures grossly normal. Other: None. MRA HEAD FINDINGS ANTERIOR CIRCULATION: Distal cervical segments of the internal carotid arteries are patent with symmetric antegrade flow. Petrous, cavernous, and supraclinoid ICAs widely patent without stenosis. A1 segments widely patent. Normal anterior communicating artery. Short-segment moderate stenosis noted involving the mid left  A2 segment (series 1028, image 10). ACAs otherwise patent to their distal aspects without stenosis. No M1 stenosis or occlusion. Negative MCA bifurcations. Distal MCA branches well perfused and symmetric. POSTERIOR CIRCULATION: Visualized vertebral arteries patent to the vertebrobasilar junction without stenosis. Partially visualized posterior inferior cerebral arteries patent bilaterally. Basilar widely patent to its distal aspect. Superior cerebral arteries patent bilaterally. Both PCAs primarily supplied via the basilar. Previously seen occlusive thrombus within the right P1 segment appears to have cleared/resolved in the interim. Short-segment moderate stenosis noted at the mid right P2 segment (series 1038, image 13). Probable distal right P3 and/or P4 occlusion with attenuation of the distal right PCA branches as compared to the left. Left PCA widely patent to its distal aspect without stenosis. No intracranial aneurysm. IMPRESSION: MRI HEAD IMPRESSION: 1. Moderate sized acute ischemic right PCA territory infarct. Associated petechial hemorrhage without hemorrhagic transformation. No significant mass effect. 2. Otherwise normal brain MRI for age. MRA HEAD IMPRESSION: 1. Interval clearing of previously seen right P1 thrombus, with probable distal right P3 and/or P4 occlusion, in keeping with the  acute right PCA territory infarct. 2. Short-segment moderate right P2 and left A2 stenoses as above. 3. Otherwise stable and essentially negative intracranial MRA. Electronically Signed   By: Rise Mu M.D.   On: 06/12/2019 21:14   MR BRAIN WO CONTRAST  Result Date: 06/12/2019 CLINICAL DATA:  Initial evaluation for acute stroke. EXAM: MRI HEAD WITHOUT CONTRAST MRA HEAD WITHOUT CONTRAST TECHNIQUE: Multiplanar, multiecho pulse sequences of the brain and surrounding structures were obtained without intravenous contrast. Angiographic images of the head were obtained using MRA technique without contrast.  COMPARISON:  Prior CTs from 06/11/2019. FINDINGS: MRI HEAD FINDINGS Brain: Cerebral volume within normal limits. Minimal scattered cerebral white matter change for age. Moderate-sized acute ischemic right PCA territory infarct involving the parasagittal right temporal occipital region. Associated petechial hemorrhage without frank hemorrhagic transformation (series 14, image 24). No significant mass effect. No other evidence for acute or subacute ischemia. Gray-white matter differentiation otherwise maintained. No other areas of remote cortical infarction. No other evidence for acute or chronic intracranial hemorrhage. No mass lesion, midline shift or mass effect. No hydrocephalus. No extra-axial fluid collection. Abnormal appearance of the pituitary gland with T1 hyperintensity seen extending from the anterior aspect of the gland into the suprasellar region (series 9, image 11). Midline structures otherwise intact and normal. Vascular: Major intracranial vascular flow voids are maintained. Skull and upper cervical spine: Craniocervical junction normal. Upper cervical spine within normal limits. Bone marrow signal intensity within normal limits. No scalp soft tissue abnormality. Sinuses/Orbits: Postoperative changes noted at the right globe. Left globe within normal limits. Orbital soft tissues unremarkable. Paranasal sinuses are clear. No mastoid effusion. Inner ear structures grossly normal. Other: None. MRA HEAD FINDINGS ANTERIOR CIRCULATION: Distal cervical segments of the internal carotid arteries are patent with symmetric antegrade flow. Petrous, cavernous, and supraclinoid ICAs widely patent without stenosis. A1 segments widely patent. Normal anterior communicating artery. Short-segment moderate stenosis noted involving the mid left A2 segment (series 1028, image 10). ACAs otherwise patent to their distal aspects without stenosis. No M1 stenosis or occlusion. Negative MCA bifurcations. Distal MCA branches  well perfused and symmetric. POSTERIOR CIRCULATION: Visualized vertebral arteries patent to the vertebrobasilar junction without stenosis. Partially visualized posterior inferior cerebral arteries patent bilaterally. Basilar widely patent to its distal aspect. Superior cerebral arteries patent bilaterally. Both PCAs primarily supplied via the basilar. Previously seen occlusive thrombus within the right P1 segment appears to have cleared/resolved in the interim. Short-segment moderate stenosis noted at the mid right P2 segment (series 1038, image 13). Probable distal right P3 and/or P4 occlusion with attenuation of the distal right PCA branches as compared to the left. Left PCA widely patent to its distal aspect without stenosis. No intracranial aneurysm. IMPRESSION: MRI HEAD IMPRESSION: 1. Moderate sized acute ischemic right PCA territory infarct. Associated petechial hemorrhage without hemorrhagic transformation. No significant mass effect. 2. Otherwise normal brain MRI for age. MRA HEAD IMPRESSION: 1. Interval clearing of previously seen right P1 thrombus, with probable distal right P3 and/or P4 occlusion, in keeping with the acute right PCA territory infarct. 2. Short-segment moderate right P2 and left A2 stenoses as above. 3. Otherwise stable and essentially negative intracranial MRA. Electronically Signed   By: Rise Mu M.D.   On: 06/12/2019 21:14   ECHOCARDIOGRAM COMPLETE  Result Date: 06/12/2019   ECHOCARDIOGRAM REPORT   Patient Name:   Tyler Herrera Date of Exam: 06/12/2019 Medical Rec #:  [id number]         Height:  74.0 in Accession #:    [id number]        Weight:       192.5 lb Date of Birth:  07-Apr-1956        BSA:          2.14 m Patient Age:    63 years          BP:           126/70 mmHg Patient Gender: M                 HR:           52 bpm. Exam Location:  Inpatient Procedure: 2D Echo Indications:    Pulmonary Embolus 415.19 / I26.99  History:        Patient has prior history  of Echocardiogram examinations, most                 recent 12/03/2016. Arrythmias:Atrial Fibrillation; Risk                 Factors:Dyslipidemia.  Sonographer:    Leta Jungling RDCS Referring Phys: [id number] SUSHANTH R AROOR IMPRESSIONS  1. Left ventricular ejection fraction, by visual estimation, is 55 to 60%. The left ventricle has normal function. There is no left ventricular hypertrophy.  2. The left ventricle has no regional wall motion abnormalities.  3. Global right ventricle has normal systolic function.The right ventricular size is normal. No increase in right ventricular wall thickness.  4. Left atrial size was mildly dilated.  5. Right atrial size was normal.  6. The mitral valve is normal in structure. Trivial mitral valve regurgitation. No evidence of mitral stenosis.  7. The tricuspid valve is normal in structure.  8. The tricuspid valve is normal in structure. Tricuspid valve regurgitation is not demonstrated.  9. The aortic valve is normal in structure. Aortic valve regurgitation is trivial. No evidence of aortic valve sclerosis or stenosis. 10. The pulmonic valve was normal in structure. Pulmonic valve regurgitation is not visualized. 11. Mildly elevated pulmonary artery systolic pressure. 12. The inferior vena cava is normal in size with greater than 50% respiratory variability, suggesting right atrial pressure of 3 mmHg. 13. There are no echocardiographic signs of hemodynamically significant pulmonary embolism. FINDINGS  Left Ventricle: Left ventricular ejection fraction, by visual estimation, is 55 to 60%. The left ventricle has normal function. The left ventricle has no regional wall motion abnormalities. There is no left ventricular hypertrophy. Left ventricular diastolic parameters were normal. Normal left atrial pressure. Right Ventricle: The right ventricular size is normal. No increase in right ventricular wall thickness. Global RV systolic function is has normal systolic function. The  tricuspid regurgitant velocity is 2.66 m/s, and with an assumed right atrial pressure  of 3 mmHg, the estimated right ventricular systolic pressure is mildly elevated at 31.3 mmHg. Left Atrium: Left atrial size was mildly dilated. Right Atrium: Right atrial size was normal in size Pericardium: There is no evidence of pericardial effusion. Mitral Valve: The mitral valve is normal in structure. Trivial mitral valve regurgitation. No evidence of mitral valve stenosis by observation. Tricuspid Valve: The tricuspid valve is normal in structure. Tricuspid valve regurgitation is not demonstrated. Aortic Valve: The aortic valve is normal in structure. Aortic valve regurgitation is trivial. The aortic valve is structurally normal, with no evidence of sclerosis or stenosis. Pulmonic Valve: The pulmonic valve was normal in structure. Pulmonic valve regurgitation is not visualized. Pulmonic regurgitation is not visualized. Aorta: The aortic root, ascending  aorta and aortic arch are all structurally normal, with no evidence of dilitation or obstruction. Venous: The inferior vena cava is normal in size with greater than 50% respiratory variability, suggesting right atrial pressure of 3 mmHg. IAS/Shunts: No atrial level shunt detected by color flow Doppler. There is no evidence of a patent foramen ovale. No ventricular septal defect is seen or detected. There is no evidence of an atrial septal defect.  LEFT VENTRICLE PLAX 2D LVIDd:         5.25 cm  Diastology LVIDs:         3.30 cm  LV e' lateral:   11.40 cm/s LV PW:         0.75 cm  LV E/e' lateral: 7.6 LV IVS:        1.15 cm  LV e' medial:    8.27 cm/s LVOT diam:     2.40 cm  LV E/e' medial:  10.5 LV SV:         88 ml LV SV Index:   41.26    2D Longitudinal Strain LVOT Area:     4.52 cm 2D Strain GLS Avg:     -21.1 %  RIGHT VENTRICLE RV S prime:     11.20 cm/s TAPSE (M-mode): 2.4 cm LEFT ATRIUM             Index       RIGHT ATRIUM           Index LA diam:        4.00 cm 1.87  cm/m  RA Area:     18.80 cm LA Vol (A2C):   59.3 ml 27.73 ml/m RA Volume:   53.60 ml  25.07 ml/m LA Vol (A4C):   81.4 ml 38.07 ml/m LA Biplane Vol: 70.8 ml 33.11 ml/m  AORTIC VALVE LVOT Vmax:   60.20 cm/s LVOT Vmean:  46.000 cm/s LVOT VTI:    0.159 m  AORTA Ao Root diam: 3.40 cm MITRAL VALVE                        TRICUSPID VALVE MV Area (PHT): 3.89 cm             TR Peak grad:   28.3 mmHg MV PHT:        56.55 msec           TR Vmax:        266.00 cm/s MV Decel Time: 195 msec MV E velocity: 87.00 cm/s 103 cm/s  SHUNTS MV A velocity: 72.00 cm/s 70.3 cm/s Systemic VTI:  0.16 m MV E/A ratio:  1.21       1.5       Systemic Diam: 2.40 cm  Rachelle Hora Croitoru MD Electronically signed by Thurmon Fair MD Signature Date/Time: 06/12/2019/12:33:16 PM    Final    CT HEAD CODE STROKE WO CONTRAST  Result Date: 06/11/2019 CLINICAL DATA:  Code stroke. Weakness, slurred speech, and facial droop. EXAM: CT HEAD WITHOUT CONTRAST TECHNIQUE: Contiguous axial images were obtained from the base of the skull through the vertex without intravenous contrast. COMPARISON:  None. FINDINGS: Brain: There is no evidence of acute infarct, intracranial hemorrhage, mass, midline shift, or extra-axial fluid collection. The ventricles and sulci are normal. Vascular: Mild carotid siphon atherosclerosis. No hyperdense vessel. Skull: No fracture suspicious osseous lesion. Sinuses/Orbits: Prior treatment of right retinal detachment. Minimal right maxillary sinus mucosal thickening. Clear mastoid air cells. Other: None. ASPECTS [facility] Stroke Program Early CT  Score) - Ganglionic level infarction (caudate, lentiform nuclei, internal capsule, insula, M1-M3 cortex): 7 - Supraganglionic infarction (M4-M6 cortex): 3 Total score (0-10 with 10 being normal): 10 IMPRESSION: 1. No evidence of acute intracranial abnormality. 2. ASPECTS is 10. These results were communicated to Dr. Laurence Slate at 8:42 pm on 06/11/2019 by text page via the [facility] messaging system.  Electronically Signed   By: Sebastian Ache M.D.   On: 06/11/2019 20:42      HISTORY OF PRESENT ILLNESS Tyler Herrera is a 64 y.o. male with past medical history significant for atrial fibrillation on aspirin 81 mg, hyperlipidemia, and kidney stones who presents as a code stroke with sudden onset slurred speech, dizziness and right-sided weakness that started 7:45 PM on 06/11/2019 ([facility]) while he was in the shower.  States that he heard a weird noise and then noticed his speech was slurred and felt right side was weak. Per EMS assessment he had right facial droop, no signficant weakness in right side. Was vomiting en route.  On arrival to [facility] ER, patient was alert and oriented x 4. BP 140 systolic. NIHSS was 3 for ataxia in both RUE and RLE and slurred speech.  Denies headache, numbess, double vision. CT head showed no acute findings.  After discussing risk versus benefit, due to disabling deficits from significant ataxia on dominant siden we administered IV tPA. NIHSS: 3. Baseline MRS 0. He was admitted to the neuro ICU.   HOSPITAL COURSE Tyler Herrera is a 64 y.o. male with history of atrial fibrillation, hyperlipidemia, kidney stones presenting with dysarthria, dizziness and R hemiparesis w/ ataxia.  Received tPA 06/11/2019 at 2044.  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 ASPECTS 10.     CTA head & neck thrombus R PCA and basilar tip. PCA still with flow. Minimal atherosclerosis head and neck.  MRI  Moderate R PCA territory infarct w/ petechial hemorrhage   MRA  R P1 thrombus resolved, now with R P3 +/- P4 occlusion given infarct. 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   2D Echo EF 55-60%. No source of embolus   LDL 140  HgbA1c 5.5  UDS pos barbituates - fioricet use as IP  aspirin 81 mg daily prior to admission, now on aspirin 325 mg daily given petechial hemorrhage. Start eliquis 1/31  post stroke given the size of stroke. Stop aspirin at that time  Therapy recommendations:  No PT, no OT   Disposition:  return home  Atrial Fibrillation with intermittent RVR  Home anticoagulation:  none, on aspirin 81 due to low CHADSVASC2 score  CHADSVASC2 score now 2 d/t stroke, candidate for [facility]   Pt follows with Dr. Johney Frame, was on Eliquis before but took off as per pt request  Denies side effects from eliquis  Pt willing to resume eliquis when needed  On sotalol PTA.continue at d.c  Resume eliquis 1/31 post stroke given the size of stroke, stop aspirin at that time  Follow up with Dr. Johney Frame   HTN  Home meds including sotalol and losartan  Stable  BP goal now < 180  Continue sotalol at d/c. Hold losartan.  Follow up with Dr. Johney Frame  Hyperlipidemia  Home meds:  No statin listed  Now on crestor 20   Hx of lipitor intolerance - monitor  LDL 140, goal < 70  Continue statin at discharge  Other Stroke Risk Factors  ETOH use, alcohol level <10, advised to drink no more than 2 drink(s) a day  Other Active Problems  AKI, Cre 1.28->1.30->1.02 - on IVF and encourage po intake  Mild leukocytosis 7.4->11.3. afebrile. UA negative  Nausea and HA, treated w/ tylenol and zofran - add fioricet  DISCHARGE EXAM Blood pressure 124/90, pulse (!) 104, temperature 98.7 F (37.1 C), temperature source Oral, resp. rate 15, height  (1.88 m), weight 87.3 kg, SpO2 98 %. General - Well nourished, well developed, in no apparent distress.  Ophthalmologic - fundi not visualized due to noncooperation.  Cardiovascular - irregularly irregular heart rate and rhythm with RVR on tele  Mental Status -  Level of arousal and orientation to time, place, and person were intact. Language including expression, naming, repetition, comprehension was assessed and found intact. Fund of Knowledge was assessed and was intact.  Cranial Nerves II - XII - II - Visual field showed  left upper quadrant decreased visual acuity  III, IV, VI - Extraocular movements intact. V - Facial sensation intact bilaterally. VII - Facial movement intact bilaterally. VIII - Hearing & vestibular intact bilaterally. X - Palate elevates symmetrically. XI - Chin turning & shoulder shrug intact bilaterally. XII - Tongue protrusion intact.  Motor Strength - The patient's strength was normal in all extremities and pronator drift was absent.  Bulk was normal and fasciculations were absent.   Motor Tone - Muscle tone was assessed at the neck and appendages and was normal.  Reflexes - The patient's reflexes were symmetrical in all extremities and he had no pathological reflexes.  Sensory - Light touch, temperature/pinprick were assessed and were symmetrical.    Coordination - The patient had normal movements in the hands and feet with no ataxia or dysmetria.  Tremor was absent.  Discharge Diet   Regular thin liquids  DISCHARGE PLAN  Disposition:  Home with wife  Continue aspirin 325 mg daily for secondary stroke prevention at time of discharge. Resume Eliquis on 1/31 and stop aspirin at that time.   Continue sotalol. Hold losartan. followup with Dr. Johney Frame in 2 weeks.   Ongoing stroke risk factor control by Primary Care Physician at time of discharge  Follow-up PCP Jarome Matin, MD in 2 weeks.  Follow-up in [facility] in 4 weeks, office to schedule an appointment.   No driving at this time, need to follow up with ophthalmology and clear for driving when appropriate. Wife and pt expressed understanding  35 minutes were spent preparing discharge.  Marvel Plan, MD PhD Stroke Neurology 06/14/2019 8:32 PM

## 2019-06-13 NOTE — Progress Notes (Signed)
STROKE TEAM PROGRESS NOTE   INTERVAL HISTORY Wife and RN at bedside. Pt complains of HA and tylenol did not help. MRI showed large right PCA infarct. 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    Vitals:   06/13/19 0400 06/13/19 0500 06/13/19 0600 06/13/19 0700  BP: 107/76 110/72 114/82 118/77  Pulse: (!) 58 (!) 53 (!) 54 (!) 57  Resp: 17 16 (!) 21 19  Temp: 98.5 F (36.9 C)     TempSrc: Oral     SpO2: 92% 93% 93% 92%  Weight:      Height:        CBC:  Recent Labs  Lab 06/11/19 2024 06/11/19 2024 06/11/19 2032 06/13/19 0425  WBC 7.4  --   --  11.3*  NEUTROABS 4.4  --   --   --   HGB 15.6   < > 15.0 14.6  HCT 45.8   < > 44.0 43.0  MCV 89.3  --   --  88.8  PLT 274  --   --  230   < > = values in this interval not displayed.    Basic Metabolic Panel:  Recent Labs  Lab 06/11/19 2024 06/11/19 2024 06/11/19 2032 06/13/19 0425  NA 142   < > 141 139  K 4.1   < > 4.0 4.0  CL 110   < > 107 106  CO2 23  --   --  25  GLUCOSE 123*   < > 117* 120*  BUN 23   < > 24* 20  CREATININE 1.28*   < > 1.30* 1.02  CALCIUM 9.3  --   --  8.8*   < > = values in this interval not displayed.   Lipid Panel:     Component Value Date/Time   CHOL 184 06/12/2019 0347   CHOL 211 (H) 01/25/2019 1543   TRIG 68 06/12/2019 0347   HDL 30 (L) 06/12/2019 0347   HDL 31 (L) 01/25/2019 1543   CHOLHDL 6.1 06/12/2019 0347   VLDL 14 06/12/2019 0347   LDLCALC 140 (H) 06/12/2019 0347   LDLCALC 139 (H) 01/25/2019 1543   HgbA1c:  Lab Results  Component Value Date   HGBA1C 5.5 06/12/2019   Urine Drug Screen: No results found for: LABOPIA, COCAINSCRNUR, LABBENZ, AMPHETMU, THCU, LABBARB  Alcohol Level     Component Value Date/Time   ETH <10 06/11/2019 2037    IMAGING past 48 hours CT ANGIO HEAD W OR WO CONTRAST  Result Date: 06/11/2019 CLINICAL DATA:  64 year old male code stroke presentation today with weakness, slurred speech, facial droop. Status post IV tPA. EXAM: CT ANGIOGRAPHY HEAD AND  NECK TECHNIQUE: Multidetector CT imaging of the head and neck was performed using the standard protocol during bolus administration of intravenous contrast. Multiplanar CT image reconstructions and MIPs were obtained to evaluate the vascular anatomy. Carotid stenosis measurements (when applicable) are obtained utilizing NASCET criteria, using the distal internal carotid diameter as the denominator. CONTRAST:  67mL OMNIPAQUE IOHEXOL 350 MG/ML SOLN COMPARISON:  Plain head CT 2034 hours. FINDINGS: CTA NECK Skeleton: Areas of poor dentition. No acute osseous abnormality identified. Mild for age cervical spine degeneration. Upper chest: Negative; mild dependent atelectasis in the upper lungs. Other neck: No acute findings in the neck. Postinflammatory calcifications of the palatine tonsils. Aortic arch: 3 vessel arch configuration with minimal arch atherosclerosis. Right carotid system: Negative. Left carotid system: Mildly tortuous left CCA without plaque or stenosis. Minimal calcified plaque at the posterior left ICA  bulb with widely patent left carotid bifurcation. No stenosis to the skull base. Vertebral arteries: No proximal right subclavian or right vertebral artery origin plaque or stenosis. Patent right vertebral artery to the skull base without stenosis. Mild if any proximal left subclavian artery plaque. Tortuous left subclavian with a kinked appearance at the thoracic inlet (series 8, image 163). Normal left vertebral artery origin. Tortuous left V1 segment. Codominant left vertebral artery is patent to the skull base without stenosis. CTA HEAD Posterior circulation: Patent distal vertebral arteries with mild tortuosity. No stenosis. Normal PICA origins. Patent basilar artery to the SCA origins, but there is thrombus at the basilar tip and right PCA origin as seen on series 8, image 133 and series 7, image 118. Posterior communicating arteries are diminutive or absent. The right PCA appears to bifurcate early,  also on image 118. And the branches remain patent despite the abnormality. The left PCA appears normal. Anterior circulation: Both ICA siphons are patent without plaque or stenosis. Patent carotid termini. Normal MCA and ACA origins. Anterior communicating artery and bilateral ACA branches are within normal limits. Left MCA M1 segment and bifurcation are patent without stenosis. Left MCA branches are within normal limits. Right MCA M1 segment and bifurcation are patent without stenosis. Right MCA branches are within normal limits. Venous sinuses: Early contrast timing limits evaluation of the left transverse and sigmoid sinuses, but the remaining major dural venous sinuses are enhancing and appear to be patent. Anatomic variants: Apparent early right PCA branching. Review of the MIP images confirms the above findings IMPRESSION: 1. Positive for thrombus at the Right PCA origin and involving some of the right Basilar tip. However, the PCA seems to remain patent and also has an unusual early bifurcation. Posterior communicating arteries are diminutive or absent. 2. The remaining posterior circulation appears negative. The anterior circulation is negative. And there is minimal atherosclerosis in the head and neck. Salient findings discussed by telephone with Dr. Samara Snide on 06/11/2019 at 2159 hours. Electronically Signed   By: Genevie Ann M.D.   On: 06/11/2019 22:07   CT ANGIO NECK W OR WO CONTRAST  Result Date: 06/11/2019 CLINICAL DATA:  64 year old male code stroke presentation today with weakness, slurred speech, facial droop. Status post IV tPA. EXAM: CT ANGIOGRAPHY HEAD AND NECK TECHNIQUE: Multidetector CT imaging of the head and neck was performed using the standard protocol during bolus administration of intravenous contrast. Multiplanar CT image reconstructions and MIPs were obtained to evaluate the vascular anatomy. Carotid stenosis measurements (when applicable) are obtained utilizing NASCET criteria,  using the distal internal carotid diameter as the denominator. CONTRAST:  31mL OMNIPAQUE IOHEXOL 350 MG/ML SOLN COMPARISON:  Plain head CT 2034 hours. FINDINGS: CTA NECK Skeleton: Areas of poor dentition. No acute osseous abnormality identified. Mild for age cervical spine degeneration. Upper chest: Negative; mild dependent atelectasis in the upper lungs. Other neck: No acute findings in the neck. Postinflammatory calcifications of the palatine tonsils. Aortic arch: 3 vessel arch configuration with minimal arch atherosclerosis. Right carotid system: Negative. Left carotid system: Mildly tortuous left CCA without plaque or stenosis. Minimal calcified plaque at the posterior left ICA bulb with widely patent left carotid bifurcation. No stenosis to the skull base. Vertebral arteries: No proximal right subclavian or right vertebral artery origin plaque or stenosis. Patent right vertebral artery to the skull base without stenosis. Mild if any proximal left subclavian artery plaque. Tortuous left subclavian with a kinked appearance at the thoracic inlet (series 8, image 163).  Normal left vertebral artery origin. Tortuous left V1 segment. Codominant left vertebral artery is patent to the skull base without stenosis. CTA HEAD Posterior circulation: Patent distal vertebral arteries with mild tortuosity. No stenosis. Normal PICA origins. Patent basilar artery to the SCA origins, but there is thrombus at the basilar tip and right PCA origin as seen on series 8, image 133 and series 7, image 118. Posterior communicating arteries are diminutive or absent. The right PCA appears to bifurcate early, also on image 118. And the branches remain patent despite the abnormality. The left PCA appears normal. Anterior circulation: Both ICA siphons are patent without plaque or stenosis. Patent carotid termini. Normal MCA and ACA origins. Anterior communicating artery and bilateral ACA branches are within normal limits. Left MCA M1 segment  and bifurcation are patent without stenosis. Left MCA branches are within normal limits. Right MCA M1 segment and bifurcation are patent without stenosis. Right MCA branches are within normal limits. Venous sinuses: Early contrast timing limits evaluation of the left transverse and sigmoid sinuses, but the remaining major dural venous sinuses are enhancing and appear to be patent. Anatomic variants: Apparent early right PCA branching. Review of the MIP images confirms the above findings IMPRESSION: 1. Positive for thrombus at the Right PCA origin and involving some of the right Basilar tip. However, the PCA seems to remain patent and also has an unusual early bifurcation. Posterior communicating arteries are diminutive or absent. 2. The remaining posterior circulation appears negative. The anterior circulation is negative. And there is minimal atherosclerosis in the head and neck. Salient findings discussed by telephone with Dr. Arther Dames on 06/11/2019 at 2159 hours. Electronically Signed   By: Odessa Fleming M.D.   On: 06/11/2019 22:07   MR ANGIO HEAD WO CONTRAST  Result Date: 06/12/2019 CLINICAL DATA:  Initial evaluation for acute stroke. EXAM: MRI HEAD WITHOUT CONTRAST MRA HEAD WITHOUT CONTRAST TECHNIQUE: Multiplanar, multiecho pulse sequences of the brain and surrounding structures were obtained without intravenous contrast. Angiographic images of the head were obtained using MRA technique without contrast. COMPARISON:  Prior CTs from 06/11/2019. FINDINGS: MRI HEAD FINDINGS Brain: Cerebral volume within normal limits. Minimal scattered cerebral white matter change for age. Moderate-sized acute ischemic right PCA territory infarct involving the parasagittal right temporal occipital region. Associated petechial hemorrhage without frank hemorrhagic transformation (series 14, image 24). No significant mass effect. No other evidence for acute or subacute ischemia. Gray-white matter differentiation otherwise  maintained. No other areas of remote cortical infarction. No other evidence for acute or chronic intracranial hemorrhage. No mass lesion, midline shift or mass effect. No hydrocephalus. No extra-axial fluid collection. Abnormal appearance of the pituitary gland with T1 hyperintensity seen extending from the anterior aspect of the gland into the suprasellar region (series 9, image 11). Midline structures otherwise intact and normal. Vascular: Major intracranial vascular flow voids are maintained. Skull and upper cervical spine: Craniocervical junction normal. Upper cervical spine within normal limits. Bone marrow signal intensity within normal limits. No scalp soft tissue abnormality. Sinuses/Orbits: Postoperative changes noted at the right globe. Left globe within normal limits. Orbital soft tissues unremarkable. Paranasal sinuses are clear. No mastoid effusion. Inner ear structures grossly normal. Other: None. MRA HEAD FINDINGS ANTERIOR CIRCULATION: Distal cervical segments of the internal carotid arteries are patent with symmetric antegrade flow. Petrous, cavernous, and supraclinoid ICAs widely patent without stenosis. A1 segments widely patent. Normal anterior communicating artery. Short-segment moderate stenosis noted involving the mid left A2 segment (series 1028, image 10). ACAs otherwise patent  to their distal aspects without stenosis. No M1 stenosis or occlusion. Negative MCA bifurcations. Distal MCA branches well perfused and symmetric. POSTERIOR CIRCULATION: Visualized vertebral arteries patent to the vertebrobasilar junction without stenosis. Partially visualized posterior inferior cerebral arteries patent bilaterally. Basilar widely patent to its distal aspect. Superior cerebral arteries patent bilaterally. Both PCAs primarily supplied via the basilar. Previously seen occlusive thrombus within the right P1 segment appears to have cleared/resolved in the interim. Short-segment moderate stenosis noted at  the mid right P2 segment (series 1038, image 13). Probable distal right P3 and/or P4 occlusion with attenuation of the distal right PCA branches as compared to the left. Left PCA widely patent to its distal aspect without stenosis. No intracranial aneurysm. IMPRESSION: MRI HEAD IMPRESSION: 1. Moderate sized acute ischemic right PCA territory infarct. Associated petechial hemorrhage without hemorrhagic transformation. No significant mass effect. 2. Otherwise normal brain MRI for age. MRA HEAD IMPRESSION: 1. Interval clearing of previously seen right P1 thrombus, with probable distal right P3 and/or P4 occlusion, in keeping with the acute right PCA territory infarct. 2. Short-segment moderate right P2 and left A2 stenoses as above. 3. Otherwise stable and essentially negative intracranial MRA. Electronically Signed   By: Rise Mu M.D.   On: 06/12/2019 21:14   MR BRAIN WO CONTRAST  Result Date: 06/12/2019 CLINICAL DATA:  Initial evaluation for acute stroke. EXAM: MRI HEAD WITHOUT CONTRAST MRA HEAD WITHOUT CONTRAST TECHNIQUE: Multiplanar, multiecho pulse sequences of the brain and surrounding structures were obtained without intravenous contrast. Angiographic images of the head were obtained using MRA technique without contrast. COMPARISON:  Prior CTs from 06/11/2019. FINDINGS: MRI HEAD FINDINGS Brain: Cerebral volume within normal limits. Minimal scattered cerebral white matter change for age. Moderate-sized acute ischemic right PCA territory infarct involving the parasagittal right temporal occipital region. Associated petechial hemorrhage without frank hemorrhagic transformation (series 14, image 24). No significant mass effect. No other evidence for acute or subacute ischemia. Gray-white matter differentiation otherwise maintained. No other areas of remote cortical infarction. No other evidence for acute or chronic intracranial hemorrhage. No mass lesion, midline shift or mass effect. No  hydrocephalus. No extra-axial fluid collection. Abnormal appearance of the pituitary gland with T1 hyperintensity seen extending from the anterior aspect of the gland into the suprasellar region (series 9, image 11). Midline structures otherwise intact and normal. Vascular: Major intracranial vascular flow voids are maintained. Skull and upper cervical spine: Craniocervical junction normal. Upper cervical spine within normal limits. Bone marrow signal intensity within normal limits. No scalp soft tissue abnormality. Sinuses/Orbits: Postoperative changes noted at the right globe. Left globe within normal limits. Orbital soft tissues unremarkable. Paranasal sinuses are clear. No mastoid effusion. Inner ear structures grossly normal. Other: None. MRA HEAD FINDINGS ANTERIOR CIRCULATION: Distal cervical segments of the internal carotid arteries are patent with symmetric antegrade flow. Petrous, cavernous, and supraclinoid ICAs widely patent without stenosis. A1 segments widely patent. Normal anterior communicating artery. Short-segment moderate stenosis noted involving the mid left A2 segment (series 1028, image 10). ACAs otherwise patent to their distal aspects without stenosis. No M1 stenosis or occlusion. Negative MCA bifurcations. Distal MCA branches well perfused and symmetric. POSTERIOR CIRCULATION: Visualized vertebral arteries patent to the vertebrobasilar junction without stenosis. Partially visualized posterior inferior cerebral arteries patent bilaterally. Basilar widely patent to its distal aspect. Superior cerebral arteries patent bilaterally. Both PCAs primarily supplied via the basilar. Previously seen occlusive thrombus within the right P1 segment appears to have cleared/resolved in the interim. Short-segment moderate stenosis noted at the  mid right P2 segment (series 1038, image 13). Probable distal right P3 and/or P4 occlusion with attenuation of the distal right PCA branches as compared to the left.  Left PCA widely patent to its distal aspect without stenosis. No intracranial aneurysm. IMPRESSION: MRI HEAD IMPRESSION: 1. Moderate sized acute ischemic right PCA territory infarct. Associated petechial hemorrhage without hemorrhagic transformation. No significant mass effect. 2. Otherwise normal brain MRI for age. MRA HEAD IMPRESSION: 1. Interval clearing of previously seen right P1 thrombus, with probable distal right P3 and/or P4 occlusion, in keeping with the acute right PCA territory infarct. 2. Short-segment moderate right P2 and left A2 stenoses as above. 3. Otherwise stable and essentially negative intracranial MRA. Electronically Signed   By: Rise Mu M.D.   On: 06/12/2019 21:14   ECHOCARDIOGRAM COMPLETE  Result Date: 06/12/2019   ECHOCARDIOGRAM REPORT   Patient Name:   Tyler Herrera Date of Exam: 06/12/2019 Medical Rec #:  [id number]         Height:       74.0 in Accession #:    [id number]        Weight:       192.5 lb Date of Birth:  08-14-1955        BSA:          2.14 m Patient Age:    63 years          BP:           126/70 mmHg Patient Gender: M                 HR:           52 bpm. Exam Location:  Inpatient Procedure: 2D Echo Indications:    Pulmonary Embolus 415.19 / I26.99  History:        Patient has prior history of Echocardiogram examinations, most                 recent 12/03/2016. Arrythmias:Atrial Fibrillation; Risk                 Factors:Dyslipidemia.  Sonographer:    Leta Jungling RDCS Referring Phys: [id number] SUSHANTH R AROOR IMPRESSIONS  1. Left ventricular ejection fraction, by visual estimation, is 55 to 60%. The left ventricle has normal function. There is no left ventricular hypertrophy.  2. The left ventricle has no regional wall motion abnormalities.  3. Global right ventricle has normal systolic function.The right ventricular size is normal. No increase in right ventricular wall thickness.  4. Left atrial size was mildly dilated.  5. Right atrial size was normal.   6. The mitral valve is normal in structure. Trivial mitral valve regurgitation. No evidence of mitral stenosis.  7. The tricuspid valve is normal in structure.  8. The tricuspid valve is normal in structure. Tricuspid valve regurgitation is not demonstrated.  9. The aortic valve is normal in structure. Aortic valve regurgitation is trivial. No evidence of aortic valve sclerosis or stenosis. 10. The pulmonic valve was normal in structure. Pulmonic valve regurgitation is not visualized. 11. Mildly elevated pulmonary artery systolic pressure. 12. The inferior vena cava is normal in size with greater than 50% respiratory variability, suggesting right atrial pressure of 3 mmHg. 13. There are no echocardiographic signs of hemodynamically significant pulmonary embolism. FINDINGS  Left Ventricle: Left ventricular ejection fraction, by visual estimation, is 55 to 60%. The left ventricle has normal function. The left ventricle has no regional wall motion abnormalities. There is no left ventricular hypertrophy.  Left ventricular diastolic parameters were normal. Normal left atrial pressure. Right Ventricle: The right ventricular size is normal. No increase in right ventricular wall thickness. Global RV systolic function is has normal systolic function. The tricuspid regurgitant velocity is 2.66 m/s, and with an assumed right atrial pressure  of 3 mmHg, the estimated right ventricular systolic pressure is mildly elevated at 31.3 mmHg. Left Atrium: Left atrial size was mildly dilated. Right Atrium: Right atrial size was normal in size Pericardium: There is no evidence of pericardial effusion. Mitral Valve: The mitral valve is normal in structure. Trivial mitral valve regurgitation. No evidence of mitral valve stenosis by observation. Tricuspid Valve: The tricuspid valve is normal in structure. Tricuspid valve regurgitation is not demonstrated. Aortic Valve: The aortic valve is normal in structure. Aortic valve regurgitation is  trivial. The aortic valve is structurally normal, with no evidence of sclerosis or stenosis. Pulmonic Valve: The pulmonic valve was normal in structure. Pulmonic valve regurgitation is not visualized. Pulmonic regurgitation is not visualized. Aorta: The aortic root, ascending aorta and aortic arch are all structurally normal, with no evidence of dilitation or obstruction. Venous: The inferior vena cava is normal in size with greater than 50% respiratory variability, suggesting right atrial pressure of 3 mmHg. IAS/Shunts: No atrial level shunt detected by color flow Doppler. There is no evidence of a patent foramen ovale. No ventricular septal defect is seen or detected. There is no evidence of an atrial septal defect.  LEFT VENTRICLE PLAX 2D LVIDd:         5.25 cm  Diastology LVIDs:         3.30 cm  LV e' lateral:   11.40 cm/s LV PW:         0.75 cm  LV E/e' lateral: 7.6 LV IVS:        1.15 cm  LV e' medial:    8.27 cm/s LVOT diam:     2.40 cm  LV E/e' medial:  10.5 LV SV:         88 ml LV SV Index:   41.26    2D Longitudinal Strain LVOT Area:     4.52 cm 2D Strain GLS Avg:     -21.1 %  RIGHT VENTRICLE RV S prime:     11.20 cm/s TAPSE (M-mode): 2.4 cm LEFT ATRIUM             Index       RIGHT ATRIUM           Index LA diam:        4.00 cm 1.87 cm/m  RA Area:     18.80 cm LA Vol (A2C):   59.3 ml 27.73 ml/m RA Volume:   53.60 ml  25.07 ml/m LA Vol (A4C):   81.4 ml 38.07 ml/m LA Biplane Vol: 70.8 ml 33.11 ml/m  AORTIC VALVE LVOT Vmax:   60.20 cm/s LVOT Vmean:  46.000 cm/s LVOT VTI:    0.159 m  AORTA Ao Root diam: 3.40 cm MITRAL VALVE                        TRICUSPID VALVE MV Area (PHT): 3.89 cm             TR Peak grad:   28.3 mmHg MV PHT:        56.55 msec           TR Vmax:        266.00 cm/s MV Decel  Time: 195 msec MV E velocity: 87.00 cm/s 103 cm/s  SHUNTS MV A velocity: 72.00 cm/s 70.3 cm/s Systemic VTI:  0.16 m MV E/A ratio:  1.21       1.5       Systemic Diam: 2.40 cm  Rachelle Hora Croitoru MD Electronically  signed by Thurmon Fair MD Signature Date/Time: 06/12/2019/12:33:16 PM    Final    CT HEAD CODE STROKE WO CONTRAST  Result Date: 06/11/2019 CLINICAL DATA:  Code stroke. Weakness, slurred speech, and facial droop. EXAM: CT HEAD WITHOUT CONTRAST TECHNIQUE: Contiguous axial images were obtained from the base of the skull through the vertex without intravenous contrast. COMPARISON:  None. FINDINGS: Brain: There is no evidence of acute infarct, intracranial hemorrhage, mass, midline shift, or extra-axial fluid collection. The ventricles and sulci are normal. Vascular: Mild carotid siphon atherosclerosis. No hyperdense vessel. Skull: No fracture suspicious osseous lesion. Sinuses/Orbits: Prior treatment of right retinal detachment. Minimal right maxillary sinus mucosal thickening. Clear mastoid air cells. Other: None. ASPECTS [facility] Stroke Program Early CT Score) - Ganglionic level infarction (caudate, lentiform nuclei, internal capsule, insula, M1-M3 cortex): 7 - Supraganglionic infarction (M4-M6 cortex): 3 Total score (0-10 with 10 being normal): 10 IMPRESSION: 1. No evidence of acute intracranial abnormality. 2. ASPECTS is 10. These results were communicated to Dr. Laurence Slate at 8:42 pm on 06/11/2019 by text page via the [facility] messaging system. Electronically Signed   By: Sebastian Ache M.D.   On: 06/11/2019 20:42    PHYSICAL EXAM  Temp:  [97.9 F (36.6 C)-98.5 F (36.9 C)] 98.5 F (36.9 C) (01/27 0400) Pulse Rate:  [49-67] 57 (01/27 0700) Resp:  [11-21] 19 (01/27 0700) BP: (107-166)/(61-92) 118/77 (01/27 0700) SpO2:  [92 %-97 %] 92 % (01/27 0700)  General - Well nourished, well developed, in no apparent distress.  Ophthalmologic - fundi not visualized due to noncooperation.  Cardiovascular - Regular rhythm and rate, not in afib but frequent PACs on tele.  Mental Status -  Level of arousal and orientation to time, place, and person were intact. Language including expression, naming, repetition,  comprehension was assessed and found intact. Fund of Knowledge was assessed and was intact.  Cranial Nerves II - XII - II - Visual field showed left upper quadrant simultagnosia  III, IV, VI - Extraocular movements intact. V - Facial sensation intact bilaterally. VII - Facial movement intact bilaterally. VIII - Hearing & vestibular intact bilaterally. X - Palate elevates symmetrically. XI - Chin turning & shoulder shrug intact bilaterally. XII - Tongue protrusion intact.  Motor Strength - The patient's strength was normal in all extremities and pronator drift was absent.  Bulk was normal and fasciculations were absent.   Motor Tone - Muscle tone was assessed at the neck and appendages and was normal.  Reflexes - The patient's reflexes were symmetrical in all extremities and he had no pathological reflexes.  Sensory - Light touch, temperature/pinprick were assessed and were symmetrical.    Coordination - The patient had normal movements in the hands and feet with no ataxia or dysmetria.  Tremor was absent.  Gait and Station - deferred.   ASSESSMENT/PLAN Mr. Christerpher Clos is a 64 y.o. male with history of atrial fibrillation, hyperlipidemia, kidney stones presenting with dysarthria, dizziness and R hemiparesis w/ ataxia.  Received tPA 06/11/2019 at 2044.  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 ASPECTS 10.     CTA head &  neck thrombus R PCA and basilar tip. PCA still with flow. Minimal atherosclerosis head and neck.  MRI  Moderate R PCA territory infarct w/ petechial hemorrhage   MRA  R P1 thrombus resolved, now with R P3 +/- P4 occlusion given infarct. 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   2D Echo EF 55-60%. No source of embolus   LDL 140  HgbA1c 5.5  UDS pending   SCDs for VTE prophylaxis  aspirin 81 mg daily prior to admission, now on aspirin 325 mg daily given petechial hemorrhage. Consider eliquis at  day 5-7 post stroke given the size of stroke  Therapy recommendations:  No PT, no OT   Disposition:  pending   Transfer to the floor  Atrial Fibrillation  Home anticoagulation:  none, on aspirin 81 due to low CHADSVASC2 score . CHADSVASC2 score now 2 d/t stroke, candidate for [facility]  . Pt follows with Dr. Johney Frame, was on Eliquis before but took off as per pt request . Denies side effects from eliquis . Pt willing to resume eliquis when needed . On sotalol PTA . Consider eliquis at day 5-7 post stroke given the size of stroke   HTN  Home meds including sotalol and losartan  Stable BP goal now < 180  Hyperlipidemia  Home meds:  No statin listed  Now on crestor 20   Hx of lipitor intolerance  LDL 140, goal < 70  Continue statin at discharge  Other Stroke Risk Factors  ETOH use, alcohol level <10, advised to drink no more than 2 drink(s) a day  Other Active Problems  AKI, Cre 1.28->1.30->1.02 - on IVF and encourage po intake  Mild leukocytosis 7.4->11.3. afebrile. UA pending   Nausea and HA, treated w/ tylenol and zofran - add fioricet  Hospital day # 2  This patient is critically ill due to stroke, thrombus at [facility] tip, s/p tPA, hx of afib and at significant risk of neurological worsening, death form hemorrhage, hemorrhagic conversion, heart failure. This patient's care requires constant monitoring of vital signs, hemodynamics, respiratory and cardiac monitoring, review of multiple databases, neurological assessment, discussion with family, other specialists and medical decision making of high complexity. I spent 35 minutes of neurocritical care time in the care of this patient. I had long discussion with wife and pt at bedside, updated pt current condition, treatment plan and potential prognosis, and answered all the questions. They expressed understanding and appreciation.    Marvel Plan, MD PhD Stroke Neurology 06/13/2019 9:40 AM    To contact Stroke Continuity  provider, please refer to [email or web address]. After hours, contact General Neurology

## 2019-06-13 NOTE — Plan of Care (Signed)
Pt able to participate in ADLs. Pt has positive attitude about self and plan of care.

## 2019-06-14 LAB — BASIC METABOLIC PANEL
Anion gap: 9 (ref 5–15)
BUN: 13 mg/dL (ref 8–23)
CO2: 23 mmol/L (ref 22–32)
Calcium: 9 mg/dL (ref 8.9–10.3)
Chloride: 108 mmol/L (ref 98–111)
Creatinine, Ser: 0.89 mg/dL (ref 0.61–1.24)
GFR calc Af Amer: >60 mL/min (ref >60)
GFR calc non Af Amer: >60 mL/min (ref >60)
Glucose, Bld: 109 mg/dL — ABNORMAL HIGH (ref 70–99)
Potassium: 3.7 mmol/L (ref 3.5–5.1)
Sodium: 140 mmol/L (ref 135–145)

## 2019-06-14 LAB — CBC
HCT: 43.8 % (ref 39.0–52.0)
Hemoglobin: 14.8 g/dL (ref 13.0–17.0)
MCH: 29.9 pg (ref 26.0–34.0)
MCHC: 33.8 g/dL (ref 30.0–36.0)
MCV: 88.5 fL (ref 80.0–100.0)
Platelets: 246 10*3/uL (ref 150–400)
RBC: 4.95 MIL/uL (ref 4.22–5.81)
RDW: 12 % (ref 11.5–15.5)
WBC: 7.7 10*3/uL (ref 4.0–10.5)
nRBC: 0 % (ref 0.0–0.2)

## 2019-06-14 MED ORDER — ASPIRIN 325 MG PO TBEC: 325.0000 mg | DELAYED_RELEASE_TABLET | Freq: Every day | ORAL | 0 refills | Status: AC

## 2019-06-14 MED ORDER — APIXABAN 5 MG PO TABS: 5.0000 mg | ORAL_TABLET | Freq: Two times a day (BID) | ORAL | 2 refills | Status: AC

## 2019-06-14 MED ORDER — BUTALBITAL-APAP-CAFFEINE 50-325-40 MG PO TABS: 1.0000 | ORAL_TABLET | Freq: Two times a day (BID) | ORAL | 0 refills | Status: AC | PRN

## 2019-06-14 MED ORDER — SOTALOL HCL 120 MG PO TABS
120.0000 mg | ORAL_TABLET | Freq: Two times a day (BID) | ORAL | Status: DC
  Filled 2019-06-14 (×2): qty 1

## 2019-06-14 MED ORDER — ROSUVASTATIN CALCIUM 20 MG PO TABS: 20.0000 mg | ORAL_TABLET | Freq: Every day | ORAL | 2 refills | Status: AC

## 2019-06-14 NOTE — Plan of Care (Signed)
  Problem: Education: Goal: Knowledge of disease or condition will improve Outcome: Progressing Goal: Knowledge of secondary prevention will improve Outcome: Progressing Goal: Knowledge of patient specific risk factors addressed and post discharge goals established will improve Outcome: Progressing Goal: Individualized Educational Video(s) Outcome: Progressing   

## 2019-06-14 NOTE — Progress Notes (Signed)
   06/14/19 0900  MEWS Score  MEWS Score Color Yellow

## 2019-06-14 NOTE — TOC Transition Note (Signed)
Transition of Care [facility]) - CM/SW Discharge Note   Patient Details  Name: Maksymilian Mabey MRN: [id number] Date of Birth: Jan 19, 1956  [facility] CM/SW Contact:  Kermit Balo, RN Phone Number: 06/14/2019, 4:50 PM   Clinical Narrative:    Pt discharging home with self care. No f/u per PT/OT and no DME needs.  CM provided him 30 day free card for Eliquis and $10 co pay card.  Pt has supervision at home via his spouse and transportation home.    Final next level of care: Home/Self Care Barriers to Discharge: No Barriers Identified   Patient Goals and CMS Choice        Discharge Placement                       Discharge Plan and Services                                     Social Determinants of Health (SDOH) Interventions     Readmission Risk Interventions No flowsheet data found.

## 2019-06-26 NOTE — ED Provider Notes (Signed)
CRITICAL CARE Performed by: Rolan Bucco Total critical care time: 30 minutes Critical care time was exclusive of separately billable procedures and treating other patients. Critical care was necessary to treat or prevent imminent or life-threatening deterioration. Critical care was time spent personally by me on the following activities: development of treatment plan with patient and/or surrogate as well as nursing, discussions with consultants, evaluation of patient's response to treatment, examination of patient, obtaining history from patient or surrogate, ordering and performing treatments and interventions, ordering and review of laboratory studies, ordering and review of radiographic studies, pulse oximetry and re-evaluation of patient's condition.  Critical care time for visit on 06/11/2019    Rolan Bucco, MD 06/26/19 [phone]

## 2019-09-05 ENCOUNTER — Other Ambulatory Visit: Payer: Self-pay | Admitting: Internal Medicine

## 2019-09-18 ENCOUNTER — Other Ambulatory Visit: Payer: Self-pay

## 2019-09-18 NOTE — Patient Outreach (Signed)
First telephone outreach attempt to obtain mRS. No answer. Left message for returned call.  Jivan Symanski [facility]-Care Management Assistant [phone] 

## 2019-09-24 ENCOUNTER — Other Ambulatory Visit: Payer: Self-pay

## 2019-09-24 NOTE — Patient Outreach (Signed)
Second telephone outreach attempt to obtain mRS. No answer. Left message for returned call.  Anija Brickner [facility]-Care Management Assistant [phone] 

## 2019-09-26 ENCOUNTER — Other Ambulatory Visit: Payer: Self-pay

## 2019-09-26 NOTE — Patient Outreach (Signed)
3 outreach attempts were completed to obtain mRs. mRs could not be obtained because patient never returned my calls. mRs=7    Laira Penninger Care Management Assistant [phone] 

## 2020-11-10 IMAGING — MR MR MRA HEAD W/O CM
1 series · 17 of 48 positions shown · non-contrast
Comparison: Prior CTs from 06/11/2019.

CLINICAL DATA: Initial evaluation for acute stroke.

EXAM:
MRI HEAD WITHOUT CONTRAST
MRA HEAD WITHOUT CONTRAST
TECHNIQUE: Multiplanar, multiecho pulse sequences of the brain and surrounding
structures were obtained without intravenous contrast. Angiographic
images of the head were obtained using MRA technique without
contrast.

[Series 1: 3d cow · axial · 0.5mm · 0.43mm/px · z∈[-79,+1]mm · 17 of 172 slices shown]
[im 1/172]
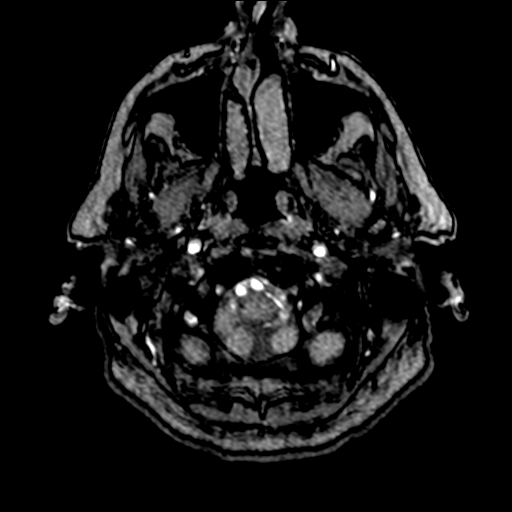
[im 4/172]
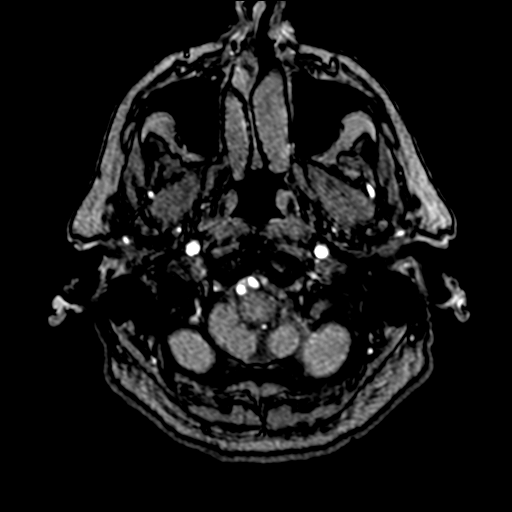
[im 8/172]
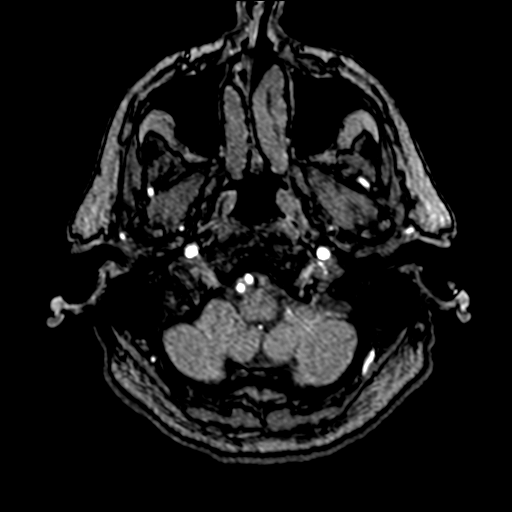
[im 11/172]
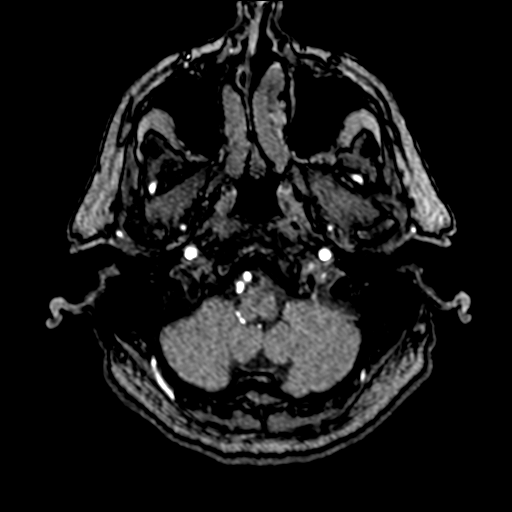
[im 15/172]
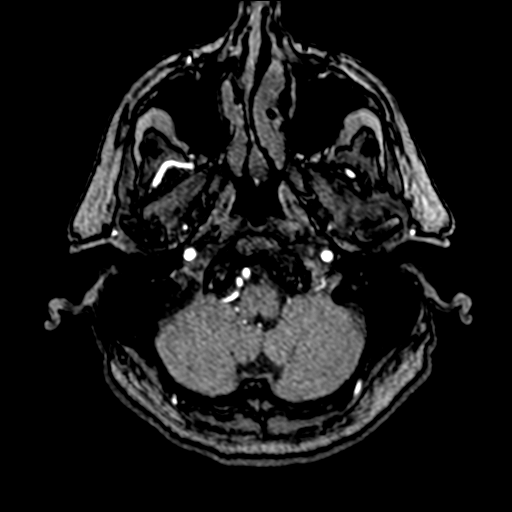
[im 19/172]
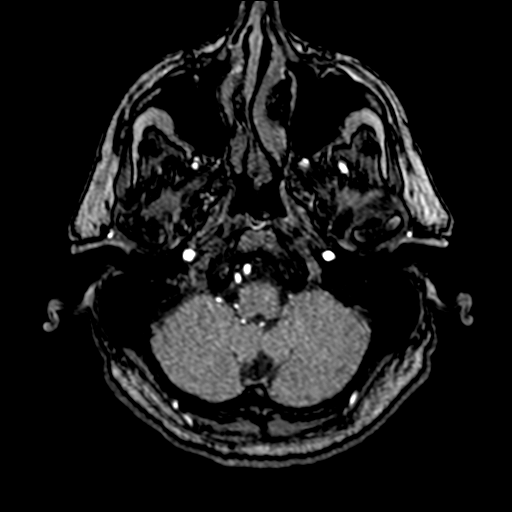
[im 22/172]
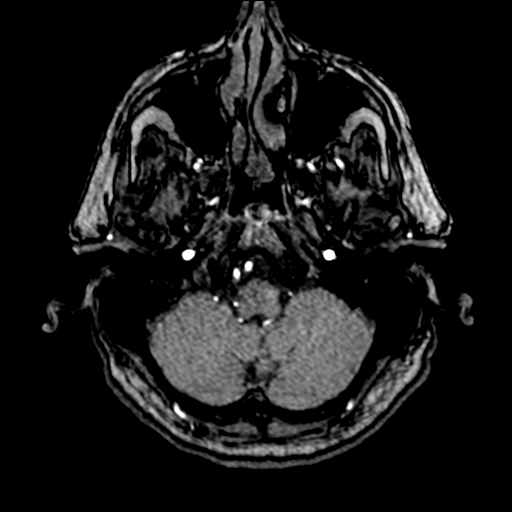
[im 30/172]
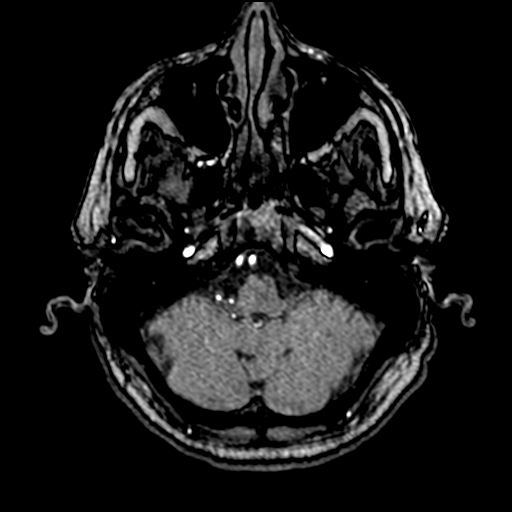
[im 33/172]
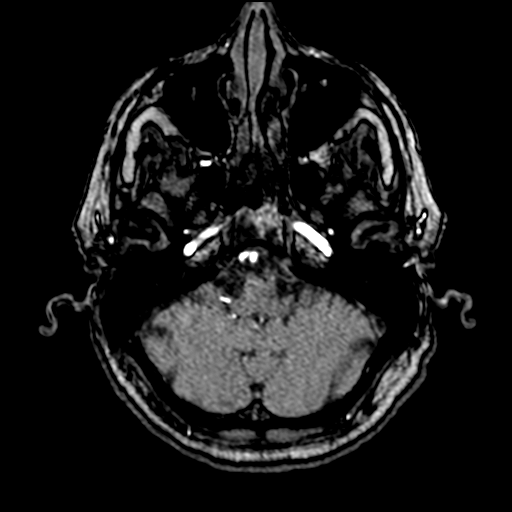
[im 55/172]
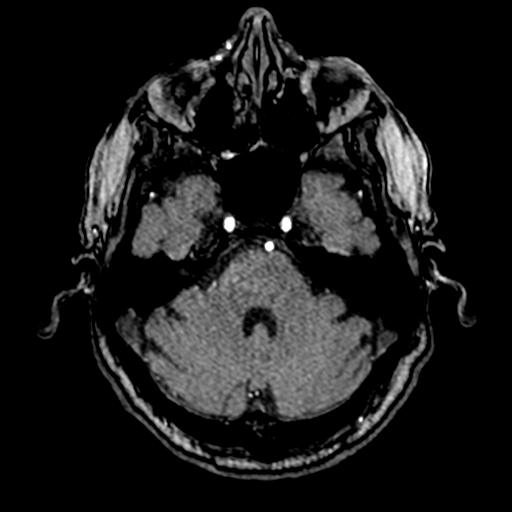
[im 77/172]
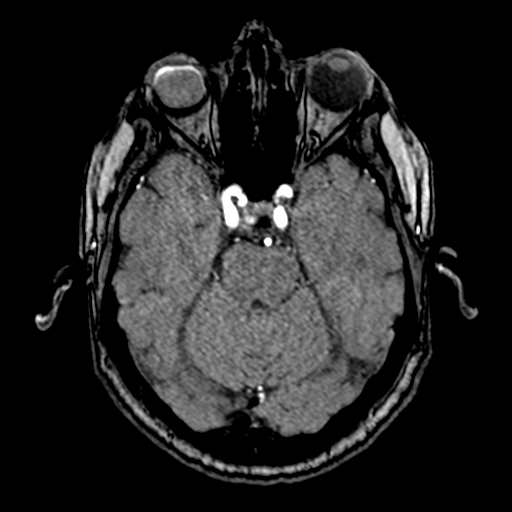
[im 88/172]
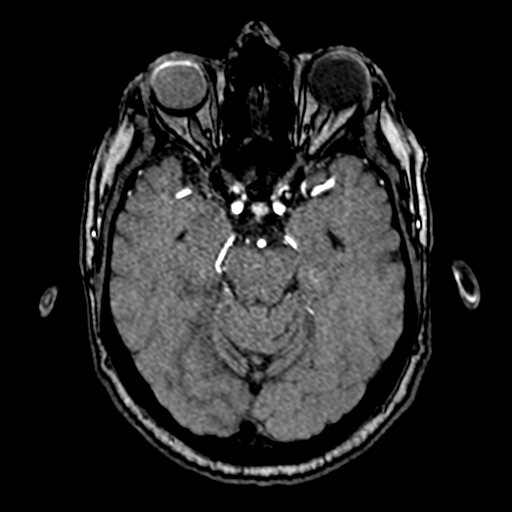
[im 99/172]
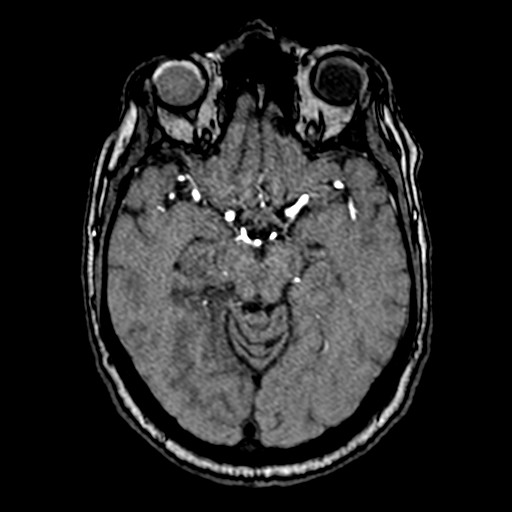
[im 121/172]
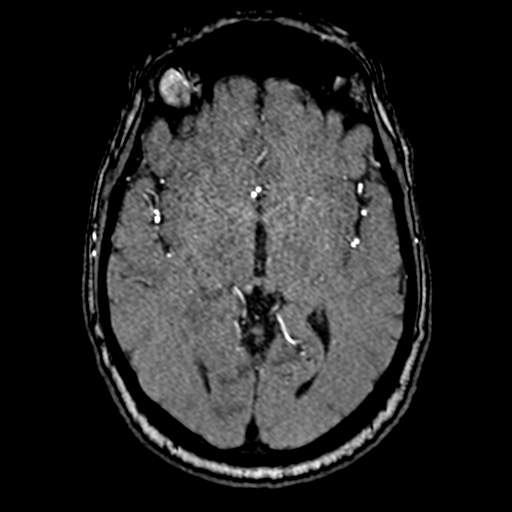
[im 142/172]
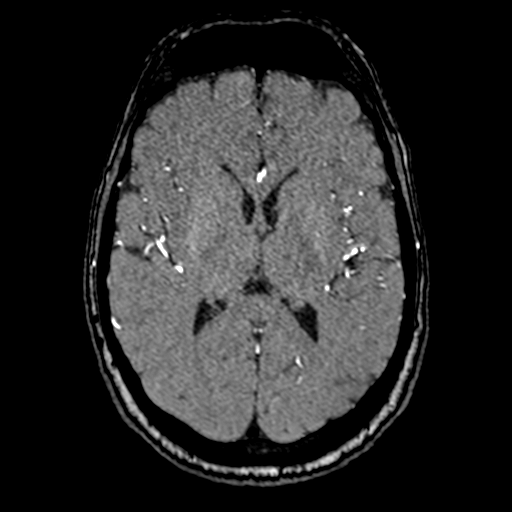
[im 146/172]
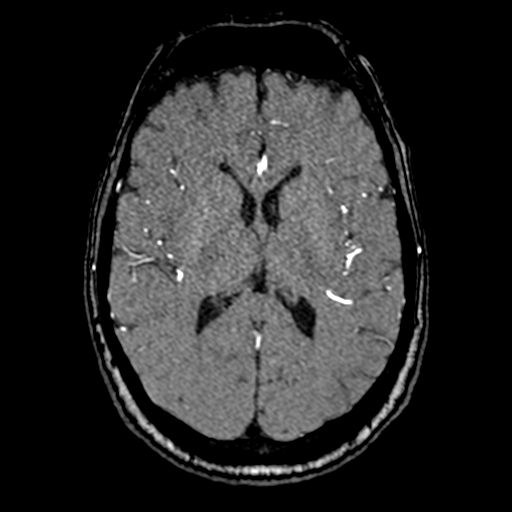
[im 164/172]
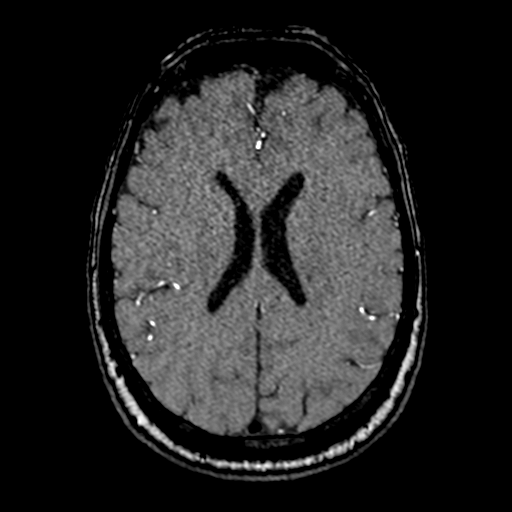

[17 of 48 positions shown; findings below may reference images not displayed]

FINDINGS: MRI HEAD FINDINGS

Brain: Cerebral volume within normal limits. Minimal scattered
cerebral white matter change for age.

Moderate-sized acute ischemic right PCA territory infarct involving
the parasagittal right temporal occipital region. Associated
petechial hemorrhage without frank hemorrhagic transformation
(series 14, image 24). No significant mass effect. No other evidence
for acute or subacute ischemia. Gray-white matter differentiation
otherwise maintained. No other areas of remote cortical infarction.
No other evidence for acute or chronic intracranial hemorrhage.

No mass lesion, midline shift or mass effect. No hydrocephalus. No
extra-axial fluid collection.

Abnormal appearance of the pituitary gland with T1 hyperintensity
seen extending from the anterior aspect of the gland into the
suprasellar region (series 9, image 11). Midline structures
otherwise intact and normal.

Vascular: Major intracranial vascular flow voids are maintained.

Skull and upper cervical spine: Craniocervical junction normal.
Upper cervical spine within normal limits. Bone marrow signal
intensity within normal limits. No scalp soft tissue abnormality.

Sinuses/Orbits: Postoperative changes noted at the right globe. Left
globe within normal limits. Orbital soft tissues unremarkable.
Paranasal sinuses are clear. No mastoid effusion. Inner ear
structures grossly normal.

Other: None.

MRA HEAD FINDINGS

ANTERIOR CIRCULATION:

Distal cervical segments of the internal carotid arteries are patent
with symmetric antegrade flow. Petrous, cavernous, and supraclinoid
ICAs widely patent without stenosis. A1 segments widely patent.
Normal anterior communicating artery. Short-segment moderate
stenosis noted involving the mid left A2 segment (series 3069, image
10). ACAs otherwise patent to their distal aspects without stenosis.
No M1 stenosis or occlusion. Negative MCA bifurcations. Distal MCA
branches well perfused and symmetric.

POSTERIOR CIRCULATION:

Visualized vertebral arteries patent to the vertebrobasilar junction
without stenosis. Partially visualized posterior inferior cerebral
arteries patent bilaterally. Basilar widely patent to its distal
aspect. Superior cerebral arteries patent bilaterally. Both PCAs
primarily supplied via the basilar. Previously seen occlusive
thrombus within the right P1 segment appears to have
cleared/resolved in the interim. Short-segment moderate stenosis
noted at the mid right P2 segment (series 1777, image 13). Probable
distal right P3 and/or P4 occlusion with attenuation of the distal
right PCA branches as compared to the left. Left PCA widely patent
to its distal aspect without stenosis.

No intracranial aneurysm.
IMPRESSION: MRI HEAD IMPRESSION:

1. Moderate sized acute ischemic right PCA territory infarct.
Associated petechial hemorrhage without hemorrhagic transformation.
No significant mass effect.
2. Otherwise normal brain MRI for age.

MRA HEAD IMPRESSION:

1. Interval clearing of previously seen right P1 thrombus, with
probable distal right P3 and/or P4 occlusion, in keeping with the
acute right PCA territory infarct.
2. Short-segment moderate right P2 and left A2 stenoses as above.
3. Otherwise stable and essentially negative intracranial MRA.
